# Patient Record
Sex: Female | Born: 1965 | Race: White | Hispanic: No | Marital: Single | Smoking: Never smoker
Health system: Southern US, Community
[De-identification: ages and names within clinical notes are randomized; demographics above are authoritative.]

## PROBLEM LIST (undated history)

## (undated) DIAGNOSIS — R569 Unspecified convulsions: Secondary | ICD-10-CM

## (undated) DIAGNOSIS — E119 Type 2 diabetes mellitus without complications: Secondary | ICD-10-CM

## (undated) DIAGNOSIS — N189 Chronic kidney disease, unspecified: Secondary | ICD-10-CM

## (undated) DIAGNOSIS — E785 Hyperlipidemia, unspecified: Secondary | ICD-10-CM

## (undated) HISTORY — DX: Chronic kidney disease, unspecified: N18.9

## (undated) HISTORY — PX: KNEE SURGERY: SHX244

## (undated) HISTORY — DX: Type 2 diabetes mellitus without complications: E11.9

## (undated) HISTORY — DX: Unspecified convulsions: R56.9

## (undated) HISTORY — DX: Hyperlipidemia, unspecified: E78.5

## (undated) HISTORY — PX: ABDOMINAL HYSTERECTOMY: SHX81

---

## 2008-11-07 ENCOUNTER — Emergency Department: Payer: Self-pay | Admitting: Emergency Medicine

## 2008-12-28 LAB — HM PAP SMEAR: HM Pap smear: NEGATIVE

## 2009-10-04 ENCOUNTER — Emergency Department: Payer: Self-pay | Admitting: Emergency Medicine

## 2010-10-06 ENCOUNTER — Ambulatory Visit: Payer: Self-pay | Admitting: Family Medicine

## 2010-10-06 LAB — HM MAMMOGRAPHY: HM MAMMO: NEGATIVE

## 2011-07-29 ENCOUNTER — Emergency Department: Payer: Self-pay | Admitting: Emergency Medicine

## 2011-08-27 ENCOUNTER — Emergency Department: Payer: Self-pay | Admitting: Emergency Medicine

## 2012-01-20 ENCOUNTER — Emergency Department: Payer: Self-pay | Admitting: Emergency Medicine

## 2014-05-14 LAB — BASIC METABOLIC PANEL
CREATININE: 0.6 mg/dL (ref 0.5–1.1)
Glucose: 116 mg/dL
Potassium: 3.9 mmol/L (ref 3.4–5.3)

## 2014-05-14 LAB — LIPID PANEL
CHOLESTEROL: 139 mg/dL (ref 0–200)
HDL: 56 mg/dL (ref 35–70)
LDL Cholesterol: 55 mg/dL
TRIGLYCERIDES: 141 mg/dL (ref 40–160)

## 2014-05-14 LAB — HEMOGLOBIN A1C: Hgb A1c MFr Bld: 7.2 % — AB (ref 4.0–6.0)

## 2014-11-16 ENCOUNTER — Encounter: Payer: Self-pay | Admitting: Family Medicine

## 2014-11-16 DIAGNOSIS — R809 Proteinuria, unspecified: Secondary | ICD-10-CM | POA: Insufficient documentation

## 2014-11-16 DIAGNOSIS — I10 Essential (primary) hypertension: Secondary | ICD-10-CM | POA: Insufficient documentation

## 2014-11-16 DIAGNOSIS — E785 Hyperlipidemia, unspecified: Secondary | ICD-10-CM | POA: Insufficient documentation

## 2014-11-16 DIAGNOSIS — E669 Obesity, unspecified: Secondary | ICD-10-CM

## 2014-11-16 DIAGNOSIS — E1129 Type 2 diabetes mellitus with other diabetic kidney complication: Secondary | ICD-10-CM | POA: Insufficient documentation

## 2014-11-16 DIAGNOSIS — IMO0001 Reserved for inherently not codable concepts without codable children: Secondary | ICD-10-CM

## 2014-11-17 ENCOUNTER — Other Ambulatory Visit: Payer: Self-pay

## 2014-11-17 ENCOUNTER — Encounter: Payer: Self-pay | Admitting: Family Medicine

## 2014-11-17 ENCOUNTER — Ambulatory Visit (INDEPENDENT_AMBULATORY_CARE_PROVIDER_SITE_OTHER): Payer: 59 | Admitting: Family Medicine

## 2014-11-17 VITALS — BP 103/70 | HR 78 | Temp 97.7°F | Wt 182.0 lb

## 2014-11-17 DIAGNOSIS — R809 Proteinuria, unspecified: Secondary | ICD-10-CM

## 2014-11-17 DIAGNOSIS — I10 Essential (primary) hypertension: Secondary | ICD-10-CM

## 2014-11-17 DIAGNOSIS — E1165 Type 2 diabetes mellitus with hyperglycemia: Secondary | ICD-10-CM | POA: Diagnosis not present

## 2014-11-17 DIAGNOSIS — E785 Hyperlipidemia, unspecified: Secondary | ICD-10-CM | POA: Diagnosis not present

## 2014-11-17 DIAGNOSIS — IMO0001 Reserved for inherently not codable concepts without codable children: Secondary | ICD-10-CM

## 2014-11-17 DIAGNOSIS — D508 Other iron deficiency anemias: Secondary | ICD-10-CM

## 2014-11-17 DIAGNOSIS — E669 Obesity, unspecified: Secondary | ICD-10-CM | POA: Diagnosis not present

## 2014-11-17 LAB — LIPID PANEL PICCOLO, WAIVED
CHOL/HDL RATIO PICCOLO,WAIVE: 2.3 mg/dL
CHOLESTEROL PICCOLO, WAIVED: 149 mg/dL (ref <200)
HDL CHOL PICCOLO, WAIVED: 63 mg/dL (ref >59)
LDL CHOL CALC PICCOLO WAIVED: 64 mg/dL (ref <100)
TRIGLYCERIDES PICCOLO,WAIVED: 107 mg/dL (ref <150)
VLDL CHOL CALC PICCOLO,WAIVE: 21 mg/dL (ref <30)

## 2014-11-17 LAB — MICROALBUMIN, URINE WAIVED
Creatinine, Urine Waived: 10 mg/dL (ref 10–300)
Microalb, Ur Waived: 10 mg/L (ref 0–19)

## 2014-11-17 LAB — BAYER DCA HB A1C WAIVED: HB A1C (BAYER DCA - WAIVED): 7.1 % — ABNORMAL HIGH (ref <7.0)

## 2014-11-17 NOTE — Assessment & Plan Note (Addendum)
Praise given, keep up the excellent job with walking; increase water intake; What It Takes to Lose Weight hand-out given

## 2014-11-17 NOTE — Assessment & Plan Note (Signed)
Eye exam UTD; foot exam by MD today, pt to keep an eye on the broken toe; check feet every night; okay to not check FSBS unless symptomatic or desired; check A1C today, goal is less than 7; continue the walking

## 2014-11-17 NOTE — Telephone Encounter (Signed)
I'm waiting on creaitnine; will plan to refill tomorrow

## 2014-11-17 NOTE — Assessment & Plan Note (Signed)
She is not willing to cut back on her saturated fats, but will try to walk and lose weight; monitor lipids and continue statin

## 2014-11-17 NOTE — Assessment & Plan Note (Signed)
Under excellent control; continue meds; try DASH guidelines

## 2014-11-17 NOTE — Patient Instructions (Signed)
We'll contact you about your lab results Your goal blood pressure is less than  130 on top. Try to follow the DASH guidelines (DASH stands for Dietary Approaches to Stop Hypertension) Try to limit the sodium in your diet.  Ideally, consume less than 1.5 grams (less than 1,500mg ) per day. Do not add salt when cooking or at the table.  Check the sodium amount on labels when shopping, and choose items lower in sodium when given a choice. Avoid or limit foods that already contain a lot of sodium. Eat a diet rich in fruits and vegetables and whole grains. If you need something for aches or pains, try to use Tylenol (acetaminphen) instead of non-steroidals (which include Aleve, ibuprofen, Advil, Motrin, and naproxen); non-steroidals can cause long-term kidney damage Check out the information at familydoctor.org entitled "What It Takes to Lose Weight" Try to lose between 1-2 pounds per week by taking in fewer calories and burning off more calories You can succeed by limiting portions, limiting foods dense in calories and fat, becoming more active, and drinking 8 glasses of water a day Don't skip meals, especially breakfast, as skipping meals may alter your metabolism Do not use over-the-counter weight loss pills or gimmicks that claim rapid weight loss A healthy BMI (or body mass index) is between 18.5 and 24.9 You can calculate your ideal BMI at the NIH website [employer]

## 2014-11-17 NOTE — Assessment & Plan Note (Signed)
Check urine microalbumin today; continue the ARB; limit NSAIDs

## 2014-11-17 NOTE — Telephone Encounter (Signed)
She was seen this morning, needs refills on her meds.

## 2014-11-17 NOTE — Progress Notes (Signed)
BP 103/70 mmHg  Pulse 78  Temp(Src) 97.7 F (36.5 C)  Wt 182 lb (82.555 kg)  SpO2 97%   Subjective:    Patient ID: Kim Andrews, female    DOB: 1966-01-23, 49 y.o.   MRN: [id number]  HPI: Kim Andrews is a 49 y.o. female  Chief Complaint  Patient presents with  . Diabetes  . Hyperlipidemia   Diabetes mellitus type 2 Patient has had diabetes since about age 60 or 20 Checking sugars?  yes How often? A few times Range (low to high) over last two weeks:  Under 130, no lows Feels diabetes is under very good control Does patient feel additional teaching/training would be helpful?  no Trying to limit white bread, white rice, white potatoes, sweets?  yes Trying to limit sweetened drinks like iced tea, soft drinks, sports drinks, fruit juices?  yes Exercise/activity level:  frequent (three or more times per week), walking Checking feet every day/night?  yes Last eye exam:  October 2015, UTD Diabetes-associated complications:  Kidney issues, on medicine to protect Lab Results  Component Value Date   HGBA1C 7.2* 05/14/2014   High cholesterol Patient has had high cholesterol since about 12 or 14 years ago Dietary intake: Do you eat more than 3 eggs per week  no, only deviled eggs at Christmas Do you try to limit saturated fats in your diet?  NO; if I'm going to die, I'm going to die happy, eats what she wants; eats a lot of things she shouldn't she admits Exercise: Exercise/activity level:  frequent (three or more times per week) Does high cholesterol run in your family?   YES Weight:  If overweight or obese, are you trying to lose weight?  yes. Trying to walk; doesn't drink much water at all  Patient saw heart doctor recently was told she broke her 2nd toe left foot; stages of healing; some pain, not limiting what she does; not sure how she broke it; no numbness in the feet  She mentioned that she takes iron every day; was told her iron was low and she needed to take  that  Relevant past medical, surgical, family and social history reviewed and updated as indicated. Interim medical history since our last visit reviewed. Allergies and medications reviewed and updated.  Review of Systems  Constitutional: Negative for unexpected weight change.  HENT: Negative for mouth sores.   Respiratory: Negative for shortness of breath.   Cardiovascular: Negative for chest pain and leg swelling.  Gastrointestinal: Negative for abdominal pain and blood in stool.  Endocrine: Positive for polydipsia (nothing new) and polyuria (nothing new).  Genitourinary: Negative for hematuria.  Musculoskeletal:       Broke her 2nd toe on left foot  Allergic/Immunologic: Negative for environmental allergies.  Neurological: Negative for tremors.  Hematological: Bruises/bleeds easily (nothing new, on aspirin).  Psychiatric/Behavioral: Negative for confusion.    Per HPI unless specifically indicated above     Objective:    BP 103/70 mmHg  Pulse 78  Temp(Src) 97.7 F (36.5 C)  Wt 182 lb (82.555 kg)  SpO2 97%  Wt Readings from Last 3 Encounters:  11/17/14 182 lb (82.555 kg)  05/14/14 179 lb (81.194 kg)    Physical Exam  Constitutional: She appears well-developed and well-nourished. No distress.  HENT:  Head: Normocephalic and atraumatic.  Eyes: EOM are normal. No scleral icterus.  Neck: No thyromegaly present.  Cardiovascular: Normal rate, regular rhythm and normal heart sounds.   No murmur heard.  Pulmonary/Chest: Effort normal and breath sounds normal. No respiratory distress. She has no wheezes.  Abdominal: Soft. Bowel sounds are normal. She exhibits no distension.  Musculoskeletal: Normal range of motion. She exhibits no edema.  Bruise on 2nd toe left foot; no deformity  Neurological: She is alert. She exhibits normal muscle tone.  Skin: Skin is warm and dry. She is not diaphoretic. No pallor.  Psychiatric: She has a normal mood and affect. Her behavior is normal.  Judgment and thought content normal.   Diabetic Foot Form - Detailed   Diabetic Foot Exam - detailed  Diabetic Foot exam was performed with the following findings:  Yes 11/17/2014  9:04 AM  Visual Foot Exam completed.:  Yes  Are the toenails long?:  No  Are the toenails thick?:  No  Normal Range of Motion:  Yes    Pulse Foot Exam completed.:  Yes  Right Dorsalis Pedis:  Present Left Dorsalis Pedis:  Present  Semmes-Weinstein Monofilament Test  R Site 1-Great Toe:  Pos L Site 1-Great Toe:  Pos    Comments:  Bruise on 2nd toe left foot; no deformity      Results for orders placed or performed in visit on 11/16/14  HM MAMMOGRAPHY  Result Value Ref Range   HM Mammogram neg   HM PAP SMEAR  Result Value Ref Range   HM Pap smear neg, no endocervical component   Basic metabolic panel  Result Value Ref Range   Glucose 116 mg/dL   Creatinine 0.6 0.5 - 1.1 mg/dL   Potassium 3.9 3.4 - 5.3 mmol/L  Lipid panel  Result Value Ref Range   Triglycerides 141 40 - 160 mg/dL   Cholesterol 423 0 - 953 mg/dL   HDL 56 35 - 70 mg/dL   LDL Cholesterol 55 mg/dL  Hemoglobin U0E  Result Value Ref Range   Hgb A1c MFr Bld 7.2 (A) 4.0 - 6.0 %      Assessment & Plan:   Problem List Items Addressed This Visit      Cardiovascular and Mediastinum   Benign hypertension - Primary    Under excellent control; continue meds; try DASH guidelines      Relevant Medications   carvedilol (COREG) 3.125 MG tablet   Other Relevant Orders   Comprehensive metabolic panel     Endocrine   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    Eye exam UTD; foot exam by MD today, pt to keep an eye on the broken toe; check feet every night; okay to not check FSBS unless symptomatic or desired; check A1C today, goal is less than 7; continue the walking      Relevant Orders   Hgb A1c w/o eAG   Microalbumin / creatinine urine ratio     Other   Hyperlipemia    She is not willing to  cut back on her saturated fats, but will try to walk and lose weight; monitor lipids and continue statin      Relevant Medications   carvedilol (COREG) 3.125 MG tablet   Other Relevant Orders   Lipid Panel w/o Chol/HDL Ratio   Urine test positive for microalbuminuria    Check urine microalbumin today; continue the ARB; limit NSAIDs      Relevant Orders   Microalbumin / creatinine urine ratio   Obesity    Praise given, keep up the excellent job with walking; increase water intake; What It Takes to Lose Weight hand-out given  Other Visit Diagnoses    Other iron deficiency anemias        check CBC and ferritin today    Relevant Medications    ferrous sulfate (SM IRON) 325 (65 FE) MG tablet    Other Relevant Orders    CBC with Differential/Platelet    Ferritin        Follow up plan: Return in about 3 months (around 02/17/2015) for diabetes and high chol.

## 2014-11-18 ENCOUNTER — Telehealth: Payer: Self-pay | Admitting: Family Medicine

## 2014-11-18 ENCOUNTER — Encounter: Payer: Self-pay | Admitting: Family Medicine

## 2014-11-18 LAB — COMPREHENSIVE METABOLIC PANEL
ALT: 33 IU/L — AB (ref 0–32)
AST: 24 IU/L (ref 0–40)
Albumin/Globulin Ratio: 2.1 (ref 1.1–2.5)
Albumin: 4.4 g/dL (ref 3.5–5.5)
Alkaline Phosphatase: 86 IU/L (ref 39–117)
BUN/Creatinine Ratio: 17 (ref 9–23)
BUN: 11 mg/dL (ref 6–24)
Bilirubin Total: <0.2 mg/dL (ref 0.0–1.2)
CALCIUM: 9.4 mg/dL (ref 8.7–10.2)
CO2: 23 mmol/L (ref 18–29)
CREATININE: 0.66 mg/dL (ref 0.57–1.00)
Chloride: 100 mmol/L (ref 97–108)
GFR calc Af Amer: 121 mL/min/{1.73_m2} (ref >59)
GFR calc non Af Amer: 105 mL/min/{1.73_m2} (ref >59)
GLUCOSE: 147 mg/dL — AB (ref 65–99)
Globulin, Total: 2.1 g/dL (ref 1.5–4.5)
POTASSIUM: 4.4 mmol/L (ref 3.5–5.2)
SODIUM: 142 mmol/L (ref 134–144)
Total Protein: 6.5 g/dL (ref 6.0–8.5)

## 2014-11-18 LAB — CBC WITH DIFFERENTIAL/PLATELET

## 2014-11-18 LAB — FERRITIN: FERRITIN: 222 ng/mL — AB (ref 15–150)

## 2014-11-18 MED ORDER — SAXAGLIPTIN-METFORMIN ER 2.5-1000 MG PO TB24: 1.0000 | ORAL_TABLET | Freq: Two times a day (BID) | ORAL | Status: DC

## 2014-11-18 MED ORDER — VALSARTAN 80 MG PO TABS: 80.0000 mg | ORAL_TABLET | Freq: Every day | ORAL | Status: DC

## 2014-11-18 MED ORDER — SIMVASTATIN 20 MG PO TABS: 20.0000 mg | ORAL_TABLET | Freq: Every day | ORAL | Status: DC

## 2014-11-18 NOTE — Telephone Encounter (Signed)
Patient notified

## 2014-11-18 NOTE — Telephone Encounter (Signed)
I sent refills of meds Let her know that she has plenty of iron stored up She can stop the iron supplement We'll recheck with other labs again in 3 months Her CBC was cancelled for some reason Okay to cancel that test unless lab has it pending (?)

## 2014-11-18 NOTE — Telephone Encounter (Signed)
Please print and send letter; printers not working for me; thanks

## 2014-11-19 NOTE — Telephone Encounter (Signed)
Letter mailed

## 2015-02-17 ENCOUNTER — Encounter: Payer: Self-pay | Admitting: Family Medicine

## 2015-02-17 ENCOUNTER — Ambulatory Visit (INDEPENDENT_AMBULATORY_CARE_PROVIDER_SITE_OTHER): Payer: 59 | Admitting: Family Medicine

## 2015-02-17 VITALS — BP 100/65 | HR 82 | Temp 97.7°F | Ht 63.0 in | Wt 181.0 lb

## 2015-02-17 DIAGNOSIS — E785 Hyperlipidemia, unspecified: Secondary | ICD-10-CM | POA: Diagnosis not present

## 2015-02-17 DIAGNOSIS — IMO0001 Reserved for inherently not codable concepts without codable children: Secondary | ICD-10-CM

## 2015-02-17 DIAGNOSIS — I10 Essential (primary) hypertension: Secondary | ICD-10-CM | POA: Diagnosis not present

## 2015-02-17 DIAGNOSIS — E669 Obesity, unspecified: Secondary | ICD-10-CM

## 2015-02-17 DIAGNOSIS — E1165 Type 2 diabetes mellitus with hyperglycemia: Secondary | ICD-10-CM

## 2015-02-17 DIAGNOSIS — I499 Cardiac arrhythmia, unspecified: Secondary | ICD-10-CM | POA: Diagnosis not present

## 2015-02-17 DIAGNOSIS — Z5181 Encounter for therapeutic drug level monitoring: Secondary | ICD-10-CM | POA: Insufficient documentation

## 2015-02-17 DIAGNOSIS — R809 Proteinuria, unspecified: Secondary | ICD-10-CM

## 2015-02-17 MED ORDER — VALSARTAN 80 MG PO TABS: 80.0000 mg | ORAL_TABLET | Freq: Every day | ORAL | Status: DC

## 2015-02-17 MED ORDER — SAXAGLIPTIN-METFORMIN ER 2.5-1000 MG PO TB24: 1.0000 | ORAL_TABLET | Freq: Two times a day (BID) | ORAL | Status: DC

## 2015-02-17 NOTE — Assessment & Plan Note (Signed)
Work on weight loss; foot exam by MD today; check feet daily; check A1C today, if under 7, return in 6 months; limit sweets, white bread, cut down on potatoes; eye exam yearly; flu shot declined

## 2015-02-17 NOTE — Progress Notes (Signed)
BP 100/65 mmHg  Pulse 82  Temp(Src) 97.7 F (36.5 C)  Ht 5\' 3"  (1.6 m)  Wt 181 lb (82.101 kg)  BMI 32.07 kg/m2  SpO2 99%   Subjective:    Patient ID: Kim Andrews, female    DOB: [birth year], 49 y.o.   MRN: [id number]  HPI: Kim Andrews is a 49 y.o. female  Chief Complaint  Patient presents with  . Diabetes  . Hyperlipidemia  . Hypertension   No interim problems  Diabetes; type 2; she has had diabetes for 11-1/2 years; checks FSBS 1x a week; tries to limit sweet drinks except at church; healthy white bread, kind of wheat, recommended by her heart doctor; does eat a lot of potatoes, knows she shouldn't; she just loves 'em; does walk; she lost one pound since last visit, hard to lose  High cholesterol; no eggs except at Christmas; eats some sausage; rare bacon with a BLT; does eat cheese; not lots of fiber  High blood pressure; adds very little salt to foods; no side effects  Sees cardiologist every 6 months  Relevant past medical, surgical, family and social history reviewed and updated as indicated. Interim medical history since our last visit reviewed. Allergies and medications reviewed and updated.  Review of Systems  Constitutional: Negative for fever and unexpected weight change.  HENT: Negative for sore throat.   Eyes: Negative for visual disturbance.  Respiratory: Negative for shortness of breath.   Cardiovascular: Negative for chest pain and leg swelling.  Gastrointestinal: Negative for abdominal pain and blood in stool.  Endocrine: Negative for polydipsia.  Genitourinary: Negative for hematuria.  Musculoskeletal: Negative for myalgias.  Skin:       itching on top of both feet  Allergic/Immunologic: Negative for food allergies.  Neurological: Negative for tremors.  Hematological: Bruises/bleeds easily (not new).  Per HPI unless specifically indicated above     Objective:    BP 100/65 mmHg  Pulse 82  Temp(Src) 97.7 F (36.5 C)  Ht 5\' 3"  (1.6 m)   Wt 181 lb (82.101 kg)  BMI 32.07 kg/m2  SpO2 99%  Wt Readings from Last 3 Encounters:  02/17/15 181 lb (82.101 kg)  11/17/14 182 lb (82.555 kg)  05/14/14 179 lb (81.194 kg)    Physical Exam  Constitutional: She appears well-developed and well-nourished. No distress.  HENT:  Head: Normocephalic and atraumatic.  Eyes: EOM are normal. No scleral icterus.  Neck: No thyromegaly present.  Cardiovascular: Normal rate, regular rhythm and normal heart sounds.   No murmur heard. Pulmonary/Chest: Effort normal and breath sounds normal. No respiratory distress. She has no wheezes.  Abdominal: Soft. Bowel sounds are normal. She exhibits no distension.  Musculoskeletal: Normal range of motion. She exhibits no edema.  Neurological: She is alert. She exhibits normal muscle tone.  Skin: Skin is warm and dry. She is not diaphoretic. No pallor.  Psychiatric: She has a normal mood and affect. Her behavior is normal. Judgment and thought content normal.   Diabetic Foot Form - Detailed   Diabetic Foot Exam - detailed  Diabetic Foot exam was performed with the following findings:  Yes 02/17/2015  8:29 AM  Visual Foot Exam completed.:  Yes  Are the shoes appropriate in style and fit?:  Yes  Are the toenails long?:  No  Are the toenails thick?:  No  Is there a claw toe deformity?:  No  Are the toenails ingrown?:  No    Pulse Foot Exam completed.:  Yes  Right Dorsalis Pedis:  Present Left Dorsalis Pedis:  Present  Sensory Foot Exam Completed.:  Yes  Swelling:  No  Semmes-Weinstein Monofilament Test  R Site 1-Great Toe:  Pos L Site 1-Great Toe:  Pos  R Site 4:  Pos L Site 4:  Pos  R Site 5:  Pos L Site 5:  Pos       Results for orders placed or performed in visit on 11/17/14  Comprehensive metabolic panel  Result Value Ref Range   Glucose 147 (H) 65 - 99 mg/dL   BUN 11 6 - 24 mg/dL   Creatinine, Ser 5.40 0.57 - 1.00 mg/dL   GFR calc non Af Amer 105 >59 mL/min/1.73   GFR calc Af Amer 121 >59  mL/min/1.73   BUN/Creatinine Ratio 17 9 - 23   Sodium 142 134 - 144 mmol/L   Potassium 4.4 3.5 - 5.2 mmol/L   Chloride 100 97 - 108 mmol/L   CO2 23 18 - 29 mmol/L   Calcium 9.4 8.7 - 10.2 mg/dL   Total Protein 6.5 6.0 - 8.5 g/dL   Albumin 4.4 3.5 - 5.5 g/dL   Globulin, Total 2.1 1.5 - 4.5 g/dL   Albumin/Globulin Ratio 2.1 1.1 - 2.5   Bilirubin Total <0.2 0.0 - 1.2 mg/dL   Alkaline Phosphatase 86 39 - 117 IU/L   AST 24 0 - 40 IU/L   ALT 33 (H) 0 - 32 IU/L  CBC with Differential/Platelet  Result Value Ref Range   WBC CANCELED    RBC CANCELED    Hemoglobin CANCELED    Hematocrit CANCELED %   Platelets CANCELED    Neutrophils CANCELED    Lymphs CANCELED    Monocytes CANCELED    Eos CANCELED    IMMATURE CELLS CANCELED    Lymphocytes Absolute CANCELED    EOS (ABSOLUTE) CANCELED    Basophils Absolute CANCELED    NRBC CANCELED %  Ferritin  Result Value Ref Range   Ferritin 222 (H) 15 - 150 ng/mL  Lipid Panel Piccolo, Waived  Result Value Ref Range   Cholesterol Piccolo, Waived 149 <200 mg/dL   HDL Chol Piccolo, Waived 63 >59 mg/dL   Triglycerides Piccolo,Waived 107 <150 mg/dL   Chol/HDL Ratio Piccolo,Waive 2.3 mg/dL   LDL Chol Calc Piccolo Waived 64 <100 mg/dL   VLDL Chol Calc Piccolo,Waive 21 <30 mg/dL  Microalbumin, Urine Waived  Result Value Ref Range   Microalb, Ur Waived 10 0 - 19 mg/L   Creatinine, Urine Waived 10 10 - 300 mg/dL   Microalb/Creat Ratio 30-300 (H) <30 mg/g  Bayer DCA Hb A1c Waived  Result Value Ref Range   Bayer DCA Hb A1c Waived 7.1 (H) <7.0 %      Assessment & Plan:   Problem List Items Addressed This Visit      Cardiovascular and Mediastinum   Benign hypertension    Very well-controlled today; limit salt; DASH guidelines      Relevant Medications   valsartan (DIOVAN) 80 MG tablet     Endocrine   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 - Primary    Work on weight loss; foot exam by MD today; check  feet daily; check A1C today, if under 7, return in 6 months; limit sweets, white bread, cut down on potatoes; eye exam yearly; flu shot declined      Relevant Medications   Saxagliptin-Metformin 2.10-998 MG TB24   valsartan (DIOVAN) 80 MG tablet   Other Relevant  Orders   Hgb A1c w/o eAG   Microalbumin / creatinine urine ratio     Other   Hyperlipemia    Limit cheese, saturated fats; stay active; work on weight loss; continue statin      Relevant Medications   valsartan (DIOVAN) 80 MG tablet   Other Relevant Orders   Lipid Panel w/o Chol/HDL Ratio   Urine test positive for microalbuminuria    Check urine today; continue ARB; limit animal protein      Relevant Orders   Microalbumin / creatinine urine ratio   Obesity    Work on weight loss      Relevant Medications   Saxagliptin-Metformin 2.10-998 MG TB24   Irregular heart beat    Follow-up with Dr. Hyacinth Meeker at [facility]      Medication monitoring encounter    Check sgpt, K+, creatinine      Relevant Orders   Comprehensive metabolic panel      Follow up plan: Return in about 6 months (around 08/17/2015) for diabetes, high cholesterol.  An after-visit summary was printed and given to the patient at check-out.  Please see the patient instructions which may contain other information and recommendations beyond what is mentioned above in the assessment and plan. Meds ordered this encounter  Medications  . Saxagliptin-Metformin 2.10-998 MG TB24    Sig: Take 1 tablet by mouth 2 (two) times daily.    Dispense:  60 tablet    Refill:  5  . valsartan (DIOVAN) 80 MG tablet    Sig: Take 1 tablet (80 mg total) by mouth daily.    Dispense:  30 tablet    Refill:  5   Orders Placed This Encounter  Procedures  . Hgb A1c w/o eAG  . Lipid Panel w/o Chol/HDL Ratio  . Comprehensive metabolic panel  . Microalbumin / creatinine urine ratio

## 2015-02-17 NOTE — Assessment & Plan Note (Signed)
Check sgpt, K+, creatinine

## 2015-02-17 NOTE — Assessment & Plan Note (Signed)
Work on weight loss.

## 2015-02-17 NOTE — Assessment & Plan Note (Signed)
Check urine today; continue ARB; limit animal protein

## 2015-02-17 NOTE — Assessment & Plan Note (Signed)
Limit cheese, saturated fats; stay active; work on weight loss; continue statin

## 2015-02-17 NOTE — Assessment & Plan Note (Signed)
Follow-up with Dr. Hyacinth Meeker at [facility]

## 2015-02-17 NOTE — Assessment & Plan Note (Signed)
Very well-controlled today; limit salt; DASH guidelines

## 2015-02-17 NOTE — Patient Instructions (Addendum)
Try the DASH guidelines See you eye doctor yearly Check your feet every night We'll mail you the lab results Return in 6 months  DASH Eating Plan DASH stands for "Dietary Approaches to Stop Hypertension." The DASH eating plan is a healthy eating plan that has been shown to reduce high blood pressure (hypertension). Additional health benefits may include reducing the risk of type 2 diabetes mellitus, heart disease, and stroke. The DASH eating plan may also help with weight loss. WHAT DO I NEED TO KNOW ABOUT THE DASH EATING PLAN? For the DASH eating plan, you will follow these general guidelines:  Choose foods with a percent daily value for sodium of less than 5% (as listed on the food label).  Use salt-free seasonings or herbs instead of table salt or sea salt.  Check with your health care provider or pharmacist before using salt substitutes.  Eat lower-sodium products, often labeled as "lower sodium" or "no salt added."  Eat fresh foods.  Eat more vegetables, fruits, and low-fat dairy products.  Choose whole grains. Look for the word "whole" as the first word in the ingredient list.  Choose fish and skinless chicken or [place] more often than red meat. Limit fish, poultry, and meat to 6 oz (170 g) each day.  Limit sweets, desserts, sugars, and sugary drinks.  Choose heart-healthy fats.  Limit cheese to 1 oz (28 g) per day.  Eat more home-cooked food and less restaurant, buffet, and fast food.  Limit fried foods.  Cook foods using methods other than frying.  Limit canned vegetables. If you do use them, rinse them well to decrease the sodium.  When eating at a restaurant, ask that your food be prepared with less salt, or no salt if possible. WHAT FOODS CAN I EAT? Seek help from a dietitian for individual calorie needs. Grains Whole grain or whole wheat bread. Brown rice. Whole grain or whole wheat pasta. Quinoa, bulgur, and whole grain cereals. Low-sodium cereals. Corn or  whole wheat flour tortillas. Whole grain cornbread. Whole grain crackers. Low-sodium crackers. Vegetables Fresh or frozen vegetables (raw, steamed, roasted, or grilled). Low-sodium or reduced-sodium tomato and vegetable juices. Low-sodium or reduced-sodium tomato sauce and paste. Low-sodium or reduced-sodium canned vegetables.  Fruits All fresh, canned (in natural juice), or frozen fruits. Meat and Other Protein Products Ground beef (85% or leaner), grass-fed beef, or beef trimmed of fat. Skinless chicken or [place]. Ground chicken or [place]. Pork trimmed of fat. All fish and seafood. Eggs. Dried beans, peas, or lentils. Unsalted nuts and seeds. Unsalted canned beans. Dairy Low-fat dairy products, such as skim or 1% milk, 2% or reduced-fat cheeses, low-fat ricotta or cottage cheese, or plain low-fat yogurt. Low-sodium or reduced-sodium cheeses. Fats and Oils Tub margarines without trans fats. Light or reduced-fat mayonnaise and salad dressings (reduced sodium). Avocado. Safflower, olive, or canola oils. Natural peanut or almond butter. Other Unsalted popcorn and pretzels. The items listed above may not be a complete list of recommended foods or beverages. Contact your dietitian for more options. WHAT FOODS ARE NOT RECOMMENDED? Grains White bread. White pasta. White rice. Refined cornbread. Bagels and croissants. Crackers that contain trans fat. Vegetables Creamed or fried vegetables. Vegetables in a cheese sauce. Regular canned vegetables. Regular canned tomato sauce and paste. Regular tomato and vegetable juices. Fruits Dried fruits. Canned fruit in light or heavy syrup. Fruit juice. Meat and Other Protein Products Fatty cuts of meat. Ribs, chicken wings, bacon, sausage, bologna, salami, chitterlings, fatback, hot dogs, bratwurst, and packaged  luncheon meats. Salted nuts and seeds. Canned beans with salt. Dairy Whole or 2% milk, cream, half-and-half, and cream cheese. Whole-fat or sweetened  yogurt. Full-fat cheeses or blue cheese. Nondairy creamers and whipped toppings. Processed cheese, cheese spreads, or cheese curds. Condiments Onion and garlic salt, seasoned salt, table salt, and sea salt. Canned and packaged gravies. Worcestershire sauce. Tartar sauce. Barbecue sauce. Teriyaki sauce. Soy sauce, including reduced sodium. Steak sauce. Fish sauce. Oyster sauce. Cocktail sauce. Horseradish. Ketchup and mustard. Meat flavorings and tenderizers. Bouillon cubes. Hot sauce. Tabasco sauce. Marinades. Taco seasonings. Relishes. Fats and Oils Butter, stick margarine, lard, shortening, ghee, and bacon fat. Coconut, palm kernel, or palm oils. Regular salad dressings. Other Pickles and olives. Salted popcorn and pretzels. The items listed above may not be a complete list of foods and beverages to avoid. Contact your dietitian for more information. WHERE CAN I FIND MORE INFORMATION? National Heart, Lung, and Blood Institute: travelstabloid.com Document Released: 05/10/2011 Document Revised: 10/05/2013 Document Reviewed: 03/25/2013 [facility] Patient Information 2015 [place], [place]. This information is not intended to replace advice given to you by your health care provider. Make sure you discuss any questions you have with your health care provider.

## 2015-02-18 LAB — COMPREHENSIVE METABOLIC PANEL
ALBUMIN: 4.1 g/dL (ref 3.5–5.5)
ALK PHOS: 90 IU/L (ref 39–117)
ALT: 28 IU/L (ref 0–32)
AST: 23 IU/L (ref 0–40)
Albumin/Globulin Ratio: 2 (ref 1.1–2.5)
BUN/Creatinine Ratio: 15 (ref 9–23)
BUN: 10 mg/dL (ref 6–24)
Bilirubin Total: 0.2 mg/dL (ref 0.0–1.2)
CO2: 23 mmol/L (ref 18–29)
Calcium: 9.2 mg/dL (ref 8.7–10.2)
Chloride: 100 mmol/L (ref 97–108)
Creatinine, Ser: 0.68 mg/dL (ref 0.57–1.00)
GFR calc Af Amer: 120 mL/min/{1.73_m2} (ref >59)
GFR, EST NON AFRICAN AMERICAN: 104 mL/min/{1.73_m2} (ref >59)
GLUCOSE: 162 mg/dL — AB (ref 65–99)
Globulin, Total: 2.1 g/dL (ref 1.5–4.5)
Potassium: 4.1 mmol/L (ref 3.5–5.2)
Sodium: 141 mmol/L (ref 134–144)
Total Protein: 6.2 g/dL (ref 6.0–8.5)

## 2015-02-18 LAB — MICROALBUMIN / CREATININE URINE RATIO: CREATININE, UR: 28.5 mg/dL

## 2015-02-18 LAB — LIPID PANEL W/O CHOL/HDL RATIO
Cholesterol, Total: 142 mg/dL (ref 100–199)
HDL: 53 mg/dL (ref >39)
LDL Calculated: 56 mg/dL (ref 0–99)
Triglycerides: 163 mg/dL — ABNORMAL HIGH (ref 0–149)
VLDL CHOLESTEROL CAL: 33 mg/dL (ref 5–40)

## 2015-02-18 LAB — HGB A1C W/O EAG: HEMOGLOBIN A1C: 7.2 % — AB (ref 4.8–5.6)

## 2015-02-25 ENCOUNTER — Telehealth: Payer: Self-pay | Admitting: Family Medicine

## 2015-02-25 NOTE — Telephone Encounter (Signed)
I talked with patient about her labs A1C not controlled; we talked about seeing back in 3 months if uncontrolled, but she really asked if she could go six months; we are a team so I will work with her; she knows she is not to goal, does not want to check labs next right at Christmas time She is not interested in starting new medicine for her diabetes; not quite to goal; suggested medicine, discussed some are $$$, one is very inexpensive (glucotrol), but she does not want to start that either Cholesterol is wonderful Urine microalbumin back to normal; fantastic I am willing to see her back in SIX months, may refill pills until then

## 2015-06-10 ENCOUNTER — Other Ambulatory Visit: Payer: Self-pay | Admitting: Family Medicine

## 2015-06-10 MED ORDER — SAXAGLIPTIN-METFORMIN ER 2.5-1000 MG PO TB24: 1.0000 | ORAL_TABLET | Freq: Two times a day (BID) | ORAL | Status: DC

## 2015-06-10 NOTE — Telephone Encounter (Signed)
Last phone note and labs reviewed; Rx approved

## 2015-06-10 NOTE — Telephone Encounter (Signed)
Pt called stated she needs a refill on Kombiglyze. [facility] [place] Thanks.

## 2015-06-10 NOTE — Telephone Encounter (Signed)
Last Visit: 02/17/2015 Next Appt: 08/17/2015 [facility] [address] [place], [place]  Request for Saxagliptin-Metformin (Kombiglyze) 2.10-998  mg tab.

## 2015-08-17 ENCOUNTER — Encounter: Payer: Self-pay | Admitting: Family Medicine

## 2015-08-17 ENCOUNTER — Telehealth: Payer: Self-pay | Admitting: Family Medicine

## 2015-08-17 ENCOUNTER — Ambulatory Visit (INDEPENDENT_AMBULATORY_CARE_PROVIDER_SITE_OTHER): Payer: BLUE CROSS/BLUE SHIELD | Admitting: Family Medicine

## 2015-08-17 VITALS — BP 116/78 | HR 80 | Temp 97.7°F | Ht 62.5 in | Wt 181.0 lb

## 2015-08-17 DIAGNOSIS — E669 Obesity, unspecified: Secondary | ICD-10-CM | POA: Diagnosis not present

## 2015-08-17 DIAGNOSIS — R809 Proteinuria, unspecified: Secondary | ICD-10-CM

## 2015-08-17 DIAGNOSIS — I10 Essential (primary) hypertension: Secondary | ICD-10-CM

## 2015-08-17 DIAGNOSIS — E785 Hyperlipidemia, unspecified: Secondary | ICD-10-CM | POA: Diagnosis not present

## 2015-08-17 DIAGNOSIS — E1165 Type 2 diabetes mellitus with hyperglycemia: Secondary | ICD-10-CM | POA: Diagnosis not present

## 2015-08-17 DIAGNOSIS — Z5181 Encounter for therapeutic drug level monitoring: Secondary | ICD-10-CM | POA: Diagnosis not present

## 2015-08-17 DIAGNOSIS — IMO0001 Reserved for inherently not codable concepts without codable children: Secondary | ICD-10-CM

## 2015-08-17 NOTE — Assessment & Plan Note (Signed)
Well-controlled; try to follow DASH guidelines 

## 2015-08-17 NOTE — Patient Instructions (Addendum)
Try krill oil capsules twice a day instead of fish or cod liver oil Please see your eye doctor, and have your eyes examined every year Check your feet every night and let me know right away of any sores, infections, numbness, etc. Try to limit sweets, white bread, white rice, white potatoes It is okay for you to not check your fingerstick blood sugars (per [employer] Best Practices) We'll contact you about labs drawn today If you have not heard anything from my staff in a week about any orders/referrals/studies from today, please contact us here to follow-up [phone] Try to work on modest weight loss, about 10 to 15 pounds before your next appointment Return in 6 months for diabetes and fasting labs

## 2015-08-17 NOTE — Assessment & Plan Note (Signed)
Check lipids today; continue statin; limit fatty foods

## 2015-08-17 NOTE — Telephone Encounter (Signed)
Erroneous entry

## 2015-08-17 NOTE — Assessment & Plan Note (Signed)
Limit red meat protein and try to get more protein from plants

## 2015-08-17 NOTE — Progress Notes (Signed)
BP 116/78 mmHg  Pulse 80  Temp(Src) 97.7 F (36.5 C)  Ht 5' 2.5" (1.588 m)  Wt 181 lb (82.101 kg)  BMI 32.56 kg/m2  SpO2 95%   Subjective:    Patient ID: Kim BouillonAngela D Andrews, female    DOB: 1965/08/12, 50 y.o.   MRN: [id number]  HPI: Kim Andrews is a 50 y.o. female  Chief Complaint  Patient presents with  . Diabetes    follow up and labs; she is up to date on her pneumonia vaccine  . Hypertension    follow up and labs  . Hyperlipidemia    follow up and labs   Type 2 diabetes; FSBS twice a week; not over 200, but would like them to be under 130; no problems with feet; no numbness or sores; no dry mouth; very few sweets, just rare occasions; last eye exam Oct; no eye problems from diabetes  HTN; well-controlled; not checking BP away from doctor; tries to limit salt; does not cook with salt, just a little at the table to taste  High cholesterol; not much fatty foods; no eggs; loves cheese; no problems with statin  Relevant past medical, surgical, family and social history reviewed and updated as indicated Father had insulin-dependent diabetes; eye problems from diabetes as well  Interim medical history since our last visit reviewed; no medical excitement since last visit  Allergies and medications reviewed and updated.  Review of Systems Per HPI unless specifically indicated above     Objective:    BP 116/78 mmHg  Pulse 80  Temp(Src) 97.7 F (36.5 C)  Ht 5' 2.5" (1.588 m)  Wt 181 lb (82.101 kg)  BMI 32.56 kg/m2  SpO2 95%  Wt Readings from Last 3 Encounters:  08/17/15 181 lb (82.101 kg)  02/17/15 181 lb (82.101 kg)  11/17/14 182 lb (82.555 kg)    Physical Exam  Constitutional: She appears well-developed and well-nourished. No distress.  HENT:  Head: Normocephalic and atraumatic.  Eyes: EOM are normal. No scleral icterus.  Neck: No thyromegaly present.  Cardiovascular: Normal rate, regular rhythm and normal heart sounds.   No murmur heard. Pulmonary/Chest:  Effort normal and breath sounds normal. No respiratory distress. She has no wheezes.  Abdominal: Soft. She exhibits no distension.  Musculoskeletal: Normal range of motion. She exhibits no edema.  Neurological: She is alert. She exhibits normal muscle tone.  Skin: Skin is warm and dry. She is not diaphoretic. No pallor.  Psychiatric: She has a normal mood and affect. Her behavior is normal. Judgment and thought content normal.    Results for orders placed or performed in visit on 02/17/15  Hgb A1c w/o eAG  Result Value Ref Range   Hgb A1c MFr Bld 7.2 (H) 4.8 - 5.6 %  Lipid Panel w/o Chol/HDL Ratio  Result Value Ref Range   Cholesterol, Total 142 100 - 199 mg/dL   Triglycerides 409163 (H) 0 - 149 mg/dL   HDL 53 >81>39 mg/dL   VLDL Cholesterol Cal 33 5 - 40 mg/dL   LDL Calculated 56 0 - 99 mg/dL  Comprehensive metabolic panel  Result Value Ref Range   Glucose 162 (H) 65 - 99 mg/dL   BUN 10 6 - 24 mg/dL   Creatinine, Ser 1.910.68 0.57 - 1.00 mg/dL   GFR calc non Af Amer 104 >59 mL/min/1.73   GFR calc Af Amer 120 >59 mL/min/1.73   BUN/Creatinine Ratio 15 9 - 23   Sodium 141 134 - 144 mmol/L  Potassium 4.1 3.5 - 5.2 mmol/L   Chloride 100 97 - 108 mmol/L   CO2 23 18 - 29 mmol/L   Calcium 9.2 8.7 - 10.2 mg/dL   Total Protein 6.2 6.0 - 8.5 g/dL   Albumin 4.1 3.5 - 5.5 g/dL   Globulin, Total 2.1 1.5 - 4.5 g/dL   Albumin/Globulin Ratio 2.0 1.1 - 2.5   Bilirubin Total 0.2 0.0 - 1.2 mg/dL   Alkaline Phosphatase 90 39 - 117 IU/L   AST 23 0 - 40 IU/L   ALT 28 0 - 32 IU/L  Microalbumin / creatinine urine ratio  Result Value Ref Range   Creatinine, Urine 28.5 Not Estab. mg/dL   Microalbum.,U,Random <3.0 Not Estab. 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 - Primary    Eye exam and foot exam UTD; limiting sweets; check A1c today; moisturize feet and keep an eye on them daily      Relevant Orders   Hgb A1c w/o eAG     Other   Hyperlipemia    Check lipids today; continue statin; limit fatty foods      Relevant Medications   carvedilol (COREG) 3.125 MG tablet   Other Relevant Orders   Lipid Panel w/o Chol/HDL Ratio   Urine test positive for microalbuminuria    Limit red meat protein and try to get more protein from plants      Relevant Orders   Microalbumin / creatinine urine ratio   Obesity    Try to lose 10-15 pounds over the coming 6 months      Medication monitoring encounter    Check labs      Relevant Orders   Comprehensive metabolic panel       Follow up plan: Return in about 6 months (around 02/17/2016) for twenty minute visit for diabetes, cholesterol.  Orders Placed This Encounter  Procedures  . Lipid Panel w/o Chol/HDL Ratio  . Hgb A1c w/o eAG  . Comprehensive metabolic panel  . Microalbumin / creatinine urine ratio   An after-visit summary was printed and given to the patient at check-out.  Please see the patient instructions which may contain other information and recommendations beyond what is mentioned above in the assessment and plan.

## 2015-08-17 NOTE — Assessment & Plan Note (Signed)
Check labs 

## 2015-08-17 NOTE — Assessment & Plan Note (Signed)
Try to lose 10-15 pounds over the coming 6 months

## 2015-08-17 NOTE — Assessment & Plan Note (Signed)
Eye exam and foot exam UTD; limiting sweets; check A1c today; moisturize feet and keep an eye on them daily

## 2015-08-18 LAB — COMPREHENSIVE METABOLIC PANEL
ALT: 32 IU/L (ref 0–32)
AST: 26 IU/L (ref 0–40)
Albumin/Globulin Ratio: 1.8 (ref 1.2–2.2)
Albumin: 4.2 g/dL (ref 3.5–5.5)
Alkaline Phosphatase: 98 IU/L (ref 39–117)
BUN/Creatinine Ratio: 11 (ref 9–23)
BUN: 7 mg/dL (ref 6–24)
Bilirubin Total: <0.2 mg/dL (ref 0.0–1.2)
CALCIUM: 9.3 mg/dL (ref 8.7–10.2)
CO2: 25 mmol/L (ref 18–29)
CREATININE: 0.63 mg/dL (ref 0.57–1.00)
Chloride: 100 mmol/L (ref 96–106)
GFR calc Af Amer: 122 mL/min/{1.73_m2} (ref >59)
GFR, EST NON AFRICAN AMERICAN: 106 mL/min/{1.73_m2} (ref >59)
GLOBULIN, TOTAL: 2.3 g/dL (ref 1.5–4.5)
Glucose: 180 mg/dL — ABNORMAL HIGH (ref 65–99)
Potassium: 4.1 mmol/L (ref 3.5–5.2)
Sodium: 142 mmol/L (ref 134–144)
Total Protein: 6.5 g/dL (ref 6.0–8.5)

## 2015-08-18 LAB — LIPID PANEL W/O CHOL/HDL RATIO
CHOLESTEROL TOTAL: 153 mg/dL (ref 100–199)
HDL: 58 mg/dL (ref >39)
LDL CALC: 77 mg/dL (ref 0–99)
Triglycerides: 89 mg/dL (ref 0–149)
VLDL Cholesterol Cal: 18 mg/dL (ref 5–40)

## 2015-08-18 LAB — MICROALBUMIN / CREATININE URINE RATIO: Creatinine, Urine: 27.3 mg/dL

## 2015-08-18 LAB — HGB A1C W/O EAG: HEMOGLOBIN A1C: 7.8 % — AB (ref 4.8–5.6)

## 2015-08-22 ENCOUNTER — Encounter: Payer: Self-pay | Admitting: Family Medicine

## 2015-08-23 ENCOUNTER — Other Ambulatory Visit: Payer: Self-pay

## 2015-08-23 ENCOUNTER — Telehealth: Payer: Self-pay

## 2015-08-23 MED ORDER — SAXAGLIPTIN-METFORMIN ER 2.5-1000 MG PO TB24: 1.0000 | ORAL_TABLET | Freq: Two times a day (BID) | ORAL | Status: DC

## 2015-08-23 NOTE — Telephone Encounter (Signed)
rx approved

## 2015-08-23 NOTE — Telephone Encounter (Signed)
Patient request that Dr. Marlise EvesLada's "nurse" call her to answer questions regarding her labs.

## 2015-08-23 NOTE — Telephone Encounter (Signed)
Patient wanted to know her lab results. I advised her that a letter would be coming to her with the results, but I read the letter to her. She says she wants to work on her diet and not add another diabetes med at this time.

## 2015-08-23 NOTE — Telephone Encounter (Signed)
Routing to provider  

## 2016-02-16 ENCOUNTER — Ambulatory Visit (INDEPENDENT_AMBULATORY_CARE_PROVIDER_SITE_OTHER): Payer: BLUE CROSS/BLUE SHIELD | Admitting: Family Medicine

## 2016-02-16 ENCOUNTER — Encounter: Payer: Self-pay | Admitting: Family Medicine

## 2016-02-16 VITALS — BP 104/72 | HR 84 | Temp 98.2°F | Wt 182.0 lb

## 2016-02-16 DIAGNOSIS — E1165 Type 2 diabetes mellitus with hyperglycemia: Secondary | ICD-10-CM | POA: Diagnosis not present

## 2016-02-16 DIAGNOSIS — E785 Hyperlipidemia, unspecified: Secondary | ICD-10-CM | POA: Diagnosis not present

## 2016-02-16 DIAGNOSIS — R809 Proteinuria, unspecified: Secondary | ICD-10-CM

## 2016-02-16 DIAGNOSIS — I10 Essential (primary) hypertension: Secondary | ICD-10-CM

## 2016-02-16 DIAGNOSIS — IMO0001 Reserved for inherently not codable concepts without codable children: Secondary | ICD-10-CM

## 2016-02-16 LAB — LIPID PANEL PICCOLO, WAIVED
CHOL/HDL RATIO PICCOLO,WAIVE: 2.5 mg/dL
Cholesterol Piccolo, Waived: 138 mg/dL (ref <200)
HDL CHOL PICCOLO, WAIVED: 55 mg/dL — AB (ref >59)
LDL Chol Calc Piccolo Waived: 61 mg/dL (ref <100)
TRIGLYCERIDES PICCOLO,WAIVED: 108 mg/dL (ref <150)
VLDL Chol Calc Piccolo,Waive: 22 mg/dL (ref <30)

## 2016-02-16 LAB — BAYER DCA HB A1C WAIVED: HB A1C: 7.5 % — AB (ref <7.0)

## 2016-02-16 MED ORDER — SAXAGLIPTIN-METFORMIN ER 2.5-1000 MG PO TB24: 1.0000 | ORAL_TABLET | Freq: Two times a day (BID) | ORAL | 6 refills | Status: DC

## 2016-02-16 MED ORDER — SIMVASTATIN 20 MG PO TABS: 20.0000 mg | ORAL_TABLET | Freq: Every day | ORAL | 5 refills | Status: DC

## 2016-02-16 MED ORDER — MELOXICAM 15 MG PO TABS: 15.0000 mg | ORAL_TABLET | Freq: Every day | ORAL | 3 refills | Status: DC

## 2016-02-16 MED ORDER — DULAGLUTIDE 0.75 MG/0.5ML ~~LOC~~ SOAJ: 0.7500 mg | SUBCUTANEOUS | 3 refills | Status: DC

## 2016-02-16 NOTE — Progress Notes (Signed)
BP 104/72 (BP Location: Left Arm, Patient Position: Sitting, Cuff Size: Large)   Pulse 84   Temp 98.2 F (36.8 C)   Wt 182 lb (82.6 kg)   SpO2 96%   BMI 32.76 kg/m    Subjective:    Patient ID: Kim Andrews, female    DOB: 09/25/1965, 50 y.o.   MRN: [id number]  HPI: Kim Andrews is a 50 y.o. female  Chief Complaint  Patient presents with  . Diabetes  . Hyperlipidemia   HYPERTENSION / HYPERLIPIDEMIA Satisfied with current treatment? yes Duration of hypertension: chronic BP monitoring frequency: not checking BP medication side effects: no Past BP meds: valsartan and carvedilol from cardiologist Duration of hyperlipidemia: chronic Cholesterol medication side effects: no Cholesterol supplements: none Past cholesterol medications: simvastatin (zocor) Medication compliance: excellent compliance Aspirin: yes Recent stressors: no Recurrent headaches: no Visual changes: no Palpitations: no Dyspnea: no Chest pain: no Lower extremity edema: no Dizzy/lightheaded: no  DIABETES Hypoglycemic episodes:no Polydipsia/polyuria: no Visual disturbance: no Chest pain: no Paresthesias: no Glucose Monitoring: yes  Accucheck frequency: Daily Taking Insulin?: no Blood Pressure Monitoring: not checking Retinal Examination: Up to Date Foot Exam: Up to Date Diabetic Education: Completed Pneumovax: Up to Date Influenza: Refused Aspirin: yes  Relevant past medical, surgical, family and social history reviewed and updated as indicated. Interim medical history since our last visit reviewed. Allergies and medications reviewed and updated.  Review of Systems  Constitutional: Negative.   Respiratory: Negative.   Cardiovascular: Negative.   Psychiatric/Behavioral: Negative.     Per HPI unless specifically indicated above     Objective:    BP 104/72 (BP Location: Left Arm, Patient Position: Sitting, Cuff Size: Large)   Pulse 84   Temp 98.2 F (36.8 C)   Wt 182 lb (82.6  kg)   SpO2 96%   BMI 32.76 kg/m   Wt Readings from Last 3 Encounters:  02/16/16 182 lb (82.6 kg)  08/17/15 181 lb (82.1 kg)  02/17/15 181 lb (82.1 kg)    Physical Exam  Constitutional: She is oriented to person, place, and time. She appears well-developed and well-nourished. No distress.  HENT:  Head: Normocephalic and atraumatic.  Right Ear: Hearing normal.  Left Ear: Hearing normal.  Nose: Nose normal.  Eyes: Conjunctivae and lids are normal. Right eye exhibits no discharge. Left eye exhibits no discharge. No scleral icterus.  Cardiovascular: Normal rate, regular rhythm, normal heart sounds and intact distal pulses.  Exam reveals no gallop and no friction rub.   No murmur heard. Pulmonary/Chest: Effort normal and breath sounds normal. No respiratory distress. She has no wheezes. She has no rales. She exhibits no tenderness.  Musculoskeletal: Normal range of motion.  Neurological: She is alert and oriented to person, place, and time.  Skin: Skin is warm, dry and intact. No rash noted. No erythema. No pallor.  Psychiatric: She has a normal mood and affect. Her speech is normal and behavior is normal. Judgment and thought content normal. Cognition and memory are normal.  Nursing note and vitals reviewed.   Results for orders placed or performed in visit on 08/17/15  Lipid Panel w/o Chol/HDL Ratio  Result Value Ref Range   Cholesterol, Total 153 100 - 199 mg/dL   Triglycerides 89 0 - 149 mg/dL   HDL 58 >40>39 mg/dL   VLDL Cholesterol Cal 18 5 - 40 mg/dL   LDL Calculated 77 0 - 99 mg/dL  Hgb [id number] w/o eAG  Result Value Ref Range  Hgb A1c MFr Bld 7.8 (H) 4.8 - 5.6 %  Comprehensive metabolic panel  Result Value Ref Range   Glucose 180 (H) 65 - 99 mg/dL   BUN 7 6 - 24 mg/dL   Creatinine, Ser 1.61 0.57 - 1.00 mg/dL   GFR calc non Af Amer 106 >59 mL/min/1.73   GFR calc Af Amer 122 >59 mL/min/1.73   BUN/Creatinine Ratio 11 9 - 23   Sodium 142 134 - 144 mmol/L   Potassium 4.1 3.5 -  5.2 mmol/L   Chloride 100 96 - 106 mmol/L   CO2 25 18 - 29 mmol/L   Calcium 9.3 8.7 - 10.2 mg/dL   Total Protein 6.5 6.0 - 8.5 g/dL   Albumin 4.2 3.5 - 5.5 g/dL   Globulin, Total 2.3 1.5 - 4.5 g/dL   Albumin/Globulin Ratio 1.8 1.2 - 2.2   Bilirubin Total <0.2 0.0 - 1.2 mg/dL   Alkaline Phosphatase 98 39 - 117 IU/L   AST 26 0 - 40 IU/L   ALT 32 0 - 32 IU/L  Microalbumin / creatinine urine ratio  Result Value Ref Range   Creatinine, Urine 27.3 Not Estab. mg/dL   Microalbum.,U,Random <3.0 Not Estab. ug/mL   MICROALB/CREAT RATIO <11.0 0.0 - 30.0 mg/g creat      Assessment & Plan:   Problem List Items Addressed This Visit      Cardiovascular and Mediastinum   Benign hypertension    Under good control. Continue current regimen. Continue to monitor.       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 First shot given in the office today. Recheck 3 months.       Relevant Medications   simvastatin (ZOCOR) 20 MG tablet   Saxagliptin-Metformin 2.10-998 MG TB24   Dulaglutide (TRULICITY) 0.75 MG/0.5ML SOPN   Other Relevant Orders   Bayer DCA Hb A1c Waived   Comprehensive metabolic panel     Other   Hyperlipemia    Under good control. Continue current regimen. Continue to monitor.       Relevant Medications   simvastatin (ZOCOR) 20 MG tablet   Other Relevant Orders   Comprehensive metabolic panel   Lipid Panel Piccolo, Waived    Other Visit Diagnoses   None.      Follow up plan: Return in about 3 months (around 05/17/2016) for DM visit, 6 months physical.

## 2016-02-16 NOTE — Assessment & Plan Note (Signed)
Under good control. Continue current regimen. Continue to monitor.  

## 2016-02-16 NOTE — Assessment & Plan Note (Signed)
Will continue current regimen and add trulicity. First shot given in the office today. Recheck 3 months.

## 2016-02-17 ENCOUNTER — Encounter: Payer: Self-pay | Admitting: Family Medicine

## 2016-02-17 LAB — COMPREHENSIVE METABOLIC PANEL
ALBUMIN: 4.5 g/dL (ref 3.5–5.5)
ALT: 28 IU/L (ref 0–32)
AST: 23 IU/L (ref 0–40)
Albumin/Globulin Ratio: 2.4 — ABNORMAL HIGH (ref 1.2–2.2)
Alkaline Phosphatase: 69 IU/L (ref 39–117)
BILIRUBIN TOTAL: 0.2 mg/dL (ref 0.0–1.2)
BUN / CREAT RATIO: 17 (ref 9–23)
BUN: 11 mg/dL (ref 6–24)
CHLORIDE: 102 mmol/L (ref 96–106)
CO2: 26 mmol/L (ref 18–29)
Calcium: 9.2 mg/dL (ref 8.7–10.2)
Creatinine, Ser: 0.65 mg/dL (ref 0.57–1.00)
GFR calc Af Amer: 121 mL/min/{1.73_m2} (ref >59)
GFR calc non Af Amer: 105 mL/min/{1.73_m2} (ref >59)
GLUCOSE: 158 mg/dL — AB (ref 65–99)
Globulin, Total: 1.9 g/dL (ref 1.5–4.5)
Potassium: 4.5 mmol/L (ref 3.5–5.2)
SODIUM: 143 mmol/L (ref 134–144)
Total Protein: 6.4 g/dL (ref 6.0–8.5)

## 2016-03-12 ENCOUNTER — Ambulatory Visit (INDEPENDENT_AMBULATORY_CARE_PROVIDER_SITE_OTHER): Payer: BLUE CROSS/BLUE SHIELD | Admitting: Family Medicine

## 2016-03-12 ENCOUNTER — Encounter: Payer: Self-pay | Admitting: Family Medicine

## 2016-03-12 VITALS — BP 134/83 | HR 89 | Temp 98.6°F | Wt 180.0 lb

## 2016-03-12 DIAGNOSIS — R42 Dizziness and giddiness: Secondary | ICD-10-CM

## 2016-03-12 MED ORDER — MECLIZINE HCL 25 MG PO TABS: 25.0000 mg | ORAL_TABLET | Freq: Three times a day (TID) | ORAL | 0 refills | Status: AC | PRN

## 2016-03-12 NOTE — Progress Notes (Signed)
BP 134/83   Pulse 89   Temp 98.6 F (37 C)   Wt 180 lb (81.6 kg)   SpO2 95%   BMI 32.40 kg/m    Subjective:    Patient ID: Kim Andrews, female    DOB: 08-05-1965, 50 y.o.   MRN: [id number]  HPI: Kim Andrews is a 50 y.o. female  Chief Complaint  Patient presents with  . Dizziness    this morning felt dizzy and like she was gonna pass out. Drank some coke thinking her sugar was low, but it didn't help. States it happens off and on but driking Coke usually helps.    Patient presents with dizzy episode earlier today. Has had these intermittently for many years. Usually resolves with drinking some coke. States they usually come on while at work walking around, and she has never actually passed out just feels "woozy". Today's episode did not fully resolve when she sat down and drank a coke. Denies N/V, visual changes, or syncope. Does have poorly controlled DM on several medication. Recently started trulicity, but has not noticed more episodes since starting that.   Relevant past medical, surgical, family and social history reviewed and updated as indicated. Interim medical history since our last visit reviewed. Allergies and medications reviewed and updated.  Review of Systems  Constitutional: Negative.   HENT: Negative.   Respiratory: Negative.   Cardiovascular: Negative.   Gastrointestinal: Negative.   Genitourinary: Negative.   Musculoskeletal: Negative.   Skin: Negative.   Neurological: Positive for dizziness.  Psychiatric/Behavioral: Negative.     Per HPI unless specifically indicated above     Objective:    BP 134/83   Pulse 89   Temp 98.6 F (37 C)   Wt 180 lb (81.6 kg)   SpO2 95%   BMI 32.40 kg/m   Wt Readings from Last 3 Encounters:  03/12/16 180 lb (81.6 kg)  02/16/16 182 lb (82.6 kg)  08/17/15 181 lb (82.1 kg)    Physical Exam  Constitutional: She is oriented to person, place, and time. She appears well-developed and well-nourished.  HENT:    Head: Atraumatic.  Eyes: Conjunctivae are normal. No scleral icterus.  Neck: Normal range of motion. Neck supple.  Cardiovascular: Normal rate and normal heart sounds.   Pulmonary/Chest: Effort normal. No respiratory distress.  Musculoskeletal: Normal range of motion.  Neurological: She is alert and oriented to person, place, and time.  Skin: Skin is warm and dry.  Psychiatric: She has a normal mood and affect. Her behavior is normal.  Nursing note and vitals reviewed.   Results for orders placed or performed in visit on 02/16/16  Bayer DCA Hb A1c Waived  Result Value Ref Range   Bayer DCA Hb A1c Waived 7.5 (H) <7.0 %  Comprehensive metabolic panel  Result Value Ref Range   Glucose 158 (H) 65 - 99 mg/dL   BUN 11 6 - 24 mg/dL   Creatinine, Ser 1.19 0.57 - 1.00 mg/dL   GFR calc non Af Amer 105 >59 mL/min/1.73   GFR calc Af Amer 121 >59 mL/min/1.73   BUN/Creatinine Ratio 17 9 - 23   Sodium 143 134 - 144 mmol/L   Potassium 4.5 3.5 - 5.2 mmol/L   Chloride 102 96 - 106 mmol/L   CO2 26 18 - 29 mmol/L   Calcium 9.2 8.7 - 10.2 mg/dL   Total Protein 6.4 6.0 - 8.5 g/dL   Albumin 4.5 3.5 - 5.5 g/dL   Globulin, Total  1.9 1.5 - 4.5 g/dL   Albumin/Globulin Ratio 2.4 (H) 1.2 - 2.2   Bilirubin Total 0.2 0.0 - 1.2 mg/dL   Alkaline Phosphatase 69 39 - 117 IU/L   AST 23 0 - 40 IU/L   ALT 28 0 - 32 IU/L  Lipid Panel Piccolo, Waived  Result Value Ref Range   Cholesterol Piccolo, Waived 138 <200 mg/dL   HDL Chol Piccolo, Waived 55 (L) >59 mg/dL   Triglycerides Piccolo,Waived 108 <150 mg/dL   Chol/HDL Ratio Piccolo,Waive 2.5 mg/dL   LDL Chol Calc Piccolo Waived 61 <100 mg/dL   VLDL Chol Calc Piccolo,Waive 22 <30 mg/dL      Assessment & Plan:   Problem List Items Addressed This Visit    None    Visit Diagnoses    Dizziness    -  Primary   EKG unchanged from previous and orthostatics stable, discussed low glycemic diet for better sugar stability. Meclizine given to see if any prn  relief.    Relevant Orders   EKG 12-Lead (Completed)       Follow up plan: Return for as scheduled.

## 2016-03-12 NOTE — Patient Instructions (Signed)
Follow up as scheduled.  

## 2016-03-29 LAB — HM DIABETES EYE EXAM

## 2016-05-17 ENCOUNTER — Ambulatory Visit (INDEPENDENT_AMBULATORY_CARE_PROVIDER_SITE_OTHER): Payer: BLUE CROSS/BLUE SHIELD | Admitting: Family Medicine

## 2016-05-17 ENCOUNTER — Encounter: Payer: Self-pay | Admitting: Family Medicine

## 2016-05-17 VITALS — BP 110/74 | HR 90 | Temp 99.0°F | Ht 62.0 in | Wt 179.6 lb

## 2016-05-17 DIAGNOSIS — E1165 Type 2 diabetes mellitus with hyperglycemia: Secondary | ICD-10-CM

## 2016-05-17 DIAGNOSIS — E1129 Type 2 diabetes mellitus with other diabetic kidney complication: Secondary | ICD-10-CM | POA: Diagnosis not present

## 2016-05-17 DIAGNOSIS — R809 Proteinuria, unspecified: Secondary | ICD-10-CM | POA: Diagnosis not present

## 2016-05-17 DIAGNOSIS — H9201 Otalgia, right ear: Secondary | ICD-10-CM

## 2016-05-17 DIAGNOSIS — IMO0002 Reserved for concepts with insufficient information to code with codable children: Secondary | ICD-10-CM

## 2016-05-17 LAB — BAYER DCA HB A1C WAIVED: HB A1C: 7 % — AB (ref <7.0)

## 2016-05-17 MED ORDER — DULAGLUTIDE 0.75 MG/0.5ML ~~LOC~~ SOAJ: 0.7500 mg | SUBCUTANEOUS | 6 refills | Status: DC

## 2016-05-17 MED ORDER — VALSARTAN 80 MG PO TABS: 80.0000 mg | ORAL_TABLET | Freq: Every day | ORAL | 6 refills | Status: DC

## 2016-05-17 MED ORDER — MELOXICAM 15 MG PO TABS: 15.0000 mg | ORAL_TABLET | Freq: Every day | ORAL | 6 refills | Status: DC

## 2016-05-17 NOTE — Progress Notes (Signed)
BP 110/74 (BP Location: Left Arm, Patient Position: Sitting, Cuff Size: Large)   Pulse 90   Temp 99 F (37.2 C)   Ht 5\' 2"  (1.575 m)   Wt 179 lb 9.6 oz (81.5 kg)   SpO2 95%   BMI 32.85 kg/m    Subjective:    Patient ID: Kim Andrews, female    DOB: 1966-03-18, 50 y.o.   MRN: [id number]  HPI: Kim Andrews is a 50 y.o. female  Chief Complaint  Patient presents with  . Diabetes   DIABETES Hypoglycemic episodes:no Polydipsia/polyuria: no Visual disturbance: no Chest pain: no Paresthesias: no Glucose Monitoring: yes  Accucheck frequency: 1x a week  Fasting glucose: 140-150 Taking Insulin?: no Blood Pressure Monitoring: not checking Retinal Examination: Up to Date Foot Exam: Up to Date Diabetic Education: Completed Pneumovax: Up to Date Influenza: Up to Date Aspirin: no   EAR PAIN Duration: 1 week Involved ear(s): right Severity:  mild  Quality:  sharp Fever: no Otorrhea: no Upper respiratory infection symptoms: no Pruritus: no Hearing loss: no Water immersion no Using Q-tips: yes Recurrent otitis media: no Status: stable Treatments attempted: none   Relevant past medical, surgical, family and social history reviewed and updated as indicated. Interim medical history since our last visit reviewed. Allergies and medications reviewed and updated.  Review of Systems  Constitutional: Negative.   HENT: Negative for sneezing, sore throat, tinnitus, trouble swallowing and voice change.   Respiratory: Negative.   Cardiovascular: Negative.   Psychiatric/Behavioral: Negative.     Per HPI unless specifically indicated above     Objective:    BP 110/74 (BP Location: Left Arm, Patient Position: Sitting, Cuff Size: Large)   Pulse 90   Temp 99 F (37.2 C)   Ht 5\' 2"  (1.575 m)   Wt 179 lb 9.6 oz (81.5 kg)   SpO2 95%   BMI 32.85 kg/m   Wt Readings from Last 3 Encounters:  05/17/16 179 lb 9.6 oz (81.5 kg)  03/12/16 180 lb (81.6 kg)  02/16/16 182 lb  (82.6 kg)    Physical Exam  Constitutional: She is oriented to person, place, and time. She appears well-developed and well-nourished. No distress.  HENT:  Head: Normocephalic and atraumatic.  Right Ear: Hearing and external ear normal.  Left Ear: Hearing and external ear normal.  Nose: Nose normal.  Mouth/Throat: Oropharynx is clear and moist. No oropharyngeal exudate.  Small scratch in R EAC  Eyes: Conjunctivae and lids are normal. Right eye exhibits no discharge. Left eye exhibits no discharge. No scleral icterus.  Cardiovascular: Normal rate, regular rhythm, normal heart sounds and intact distal pulses.  Exam reveals no gallop and no friction rub.   No murmur heard. Pulmonary/Chest: Effort normal and breath sounds normal. No respiratory distress. She has no wheezes. She has no rales. She exhibits no tenderness.  Musculoskeletal: Normal range of motion.  Neurological: She is alert and oriented to person, place, and time.  Skin: Skin is warm, dry and intact. No rash noted. She is not diaphoretic. No erythema. No pallor.  Psychiatric: She has a normal mood and affect. Her speech is normal and behavior is normal. Judgment and thought content normal. Cognition and memory are normal.  Nursing note and vitals reviewed.   Results for orders placed or performed in visit on 03/30/16  HM DIABETES EYE EXAM  Result Value Ref Range   HM Diabetic Eye Exam No Retinopathy No Retinopathy      Assessment & Plan:  Problem List Items Addressed This Visit      Endocrine   Uncontrolled type 2 diabetes mellitus with microalbuminuria (HCC) - Primary    A1c down to 7.0. Continue current regimen. Continue to monitor. Recheck 3 months      Relevant Medications   Dulaglutide (TRULICITY) 0.75 MG/0.5ML SOPN   valsartan (DIOVAN) 80 MG tablet   Other Relevant Orders   Bayer DCA Hb A1c Waived    Other Visit Diagnoses    Ear pain, right       Scratch in the EAC- avoid q-tips. Call if not getting  better       Follow up plan: Return in about 3 months (around 08/15/2016) for Physical and DM visit.

## 2016-05-17 NOTE — Assessment & Plan Note (Signed)
A1c down to 7.0. Continue current regimen. Continue to monitor. Recheck 3 months

## 2016-07-02 ENCOUNTER — Encounter: Payer: Self-pay | Admitting: Family Medicine

## 2016-07-02 ENCOUNTER — Ambulatory Visit (INDEPENDENT_AMBULATORY_CARE_PROVIDER_SITE_OTHER): Payer: BLUE CROSS/BLUE SHIELD | Admitting: Family Medicine

## 2016-07-02 VITALS — BP 110/75 | HR 102 | Temp 98.4°F | Wt 182.0 lb

## 2016-07-02 DIAGNOSIS — L03116 Cellulitis of left lower limb: Secondary | ICD-10-CM | POA: Diagnosis not present

## 2016-07-02 MED ORDER — SULFAMETHOXAZOLE-TRIMETHOPRIM 800-160 MG PO TABS: 1.0000 | ORAL_TABLET | Freq: Two times a day (BID) | ORAL | 0 refills | Status: DC

## 2016-07-02 MED ORDER — TRAMADOL HCL 50 MG PO TABS: 50.0000 mg | ORAL_TABLET | Freq: Three times a day (TID) | ORAL | 0 refills | Status: AC | PRN

## 2016-07-02 NOTE — Progress Notes (Signed)
   BP 110/75   Pulse (!) 102   Temp 98.4 F (36.9 C)   Wt 182 lb (82.6 kg)   SpO2 95%   BMI 33.29 kg/m    Subjective:    Patient ID: Kim Andrews, female    DOB: Jan 14, 1966, 51 y.o.   MRN: [id number]  HPI: Kim Andrews is a 51 y.o. female  Chief Complaint  Patient presents with  . Rash    on her left leg x 2-3 days. Redness, itching, warm to the touch. No burning. Thinks its related to her brace.    Patient presents with worsening red, painful, itchy rash surrounding left knee that began about 3 days ago. States something similar happened in the past when she used this particular leg brace that she has recently gotten back into wearing. Denies fever, chills, new injury. Using gold bond and cortisone cream and those didn't help. Has been trying tylenol with no relief and takes meloxicam daily for knee pain but it doesn't seem to be touching the pain from this rash.   Relevant past medical, surgical, family and social history reviewed and updated as indicated. Interim medical history since our last visit reviewed. Allergies and medications reviewed and updated.  Review of Systems  Constitutional: Negative.   HENT: Negative.   Eyes: Negative.   Respiratory: Negative.   Cardiovascular: Negative.   Gastrointestinal: Negative.   Genitourinary: Negative.   Neurological: Negative.   Psychiatric/Behavioral: Negative.     Per HPI unless specifically indicated above     Objective:    BP 110/75   Pulse (!) 102   Temp 98.4 F (36.9 C)   Wt 182 lb (82.6 kg)   SpO2 95%   BMI 33.29 kg/m   Wt Readings from Last 3 Encounters:  07/02/16 182 lb (82.6 kg)  05/17/16 179 lb 9.6 oz (81.5 kg)  03/12/16 180 lb (81.6 kg)    Physical Exam  Constitutional: She is oriented to person, place, and time. She appears well-developed and well-nourished. No distress.  HENT:  Head: Atraumatic.  Eyes: Conjunctivae are normal. Pupils are equal, round, and reactive to light. No scleral icterus.    Neck: Normal range of motion. Neck supple.  Cardiovascular: Normal rate and normal heart sounds.   Pulmonary/Chest: Effort normal and breath sounds normal. No respiratory distress.  Musculoskeletal: Normal range of motion.  Neurological: She is alert and oriented to person, place, and time.  Skin: Skin is warm and dry. There is erythema (Diffuse erythema and warmth surrounding left knee with several places of open abrasion. ).  Psychiatric: She has a normal mood and affect. Her behavior is normal.  Nursing note and vitals reviewed.     Assessment & Plan:   Problem List Items Addressed This Visit    None    Visit Diagnoses    Cellulitis of left lower extremity    -  Primary   D/c current leg brace. Bactrim sent for 7 day course. Apply neosporin twice daily, ice to area for pain relief. Recheck in 7 days. F/u if worsening sooner       Follow up plan: Return in about 1 week (around 07/09/2016) for Wound check.

## 2016-07-02 NOTE — Patient Instructions (Signed)
Follow up in 1 week for recheck 

## 2016-07-09 ENCOUNTER — Ambulatory Visit (INDEPENDENT_AMBULATORY_CARE_PROVIDER_SITE_OTHER): Payer: BLUE CROSS/BLUE SHIELD | Admitting: Family Medicine

## 2016-07-09 ENCOUNTER — Encounter: Payer: Self-pay | Admitting: Family Medicine

## 2016-07-09 VITALS — BP 121/71 | HR 88 | Temp 97.5°F | Wt 184.0 lb

## 2016-07-09 DIAGNOSIS — L03116 Cellulitis of left lower limb: Secondary | ICD-10-CM | POA: Diagnosis not present

## 2016-07-09 NOTE — Patient Instructions (Signed)
Follow up as scheduled for your diabetes check

## 2016-07-09 NOTE — Progress Notes (Signed)
   BP 121/71   Pulse 88   Temp 97.5 F (36.4 C)   Wt 184 lb (83.5 kg)   SpO2 98%   BMI 33.65 kg/m    Subjective:    Patient ID: Kim Andrews, female    DOB: [birth year], 51 y.o.   MRN: [id number]  HPI: Kim Andrews is a 51 y.o. female  Chief Complaint  Patient presents with  . Cellulitis    recheck leg wound, she finished the antibiotic this morning. States it is much better.   Patient presents for 1 week recheck of left leg cellulitis around her knee. Has gotten a new knee brace and completed full course of antibiotics. Using neosporin and elevating leg when at rest. States pain is much improved, and that area is doing much better. Denies fever, chills, aches, or drainage in the area.   Relevant past medical, surgical, family and social history reviewed and updated as indicated. Interim medical history since our last visit reviewed. Allergies and medications reviewed and updated.  Review of Systems  Constitutional: Negative.   HENT: Negative.   Respiratory: Negative.   Cardiovascular: Negative.   Gastrointestinal: Negative.   Genitourinary: Negative.   Musculoskeletal: Negative.   Skin: Positive for color change.  Neurological: Negative.   Psychiatric/Behavioral: Negative.     Per HPI unless specifically indicated above     Objective:    BP 121/71   Pulse 88   Temp 97.5 F (36.4 C)   Wt 184 lb (83.5 kg)   SpO2 98%   BMI 33.65 kg/m   Wt Readings from Last 3 Encounters:  07/09/16 184 lb (83.5 kg)  07/02/16 182 lb (82.6 kg)  05/17/16 179 lb 9.6 oz (81.5 kg)    Physical Exam  Constitutional: She is oriented to person, place, and time. She appears well-developed and well-nourished.  HENT:  Head: Atraumatic.  Eyes: Conjunctivae are normal. Pupils are equal, round, and reactive to light. No scleral icterus.  Neck: Normal range of motion. Neck supple.  Cardiovascular: Normal rate and normal heart sounds.   Pulmonary/Chest: Effort normal and breath sounds  normal. No respiratory distress.  Musculoskeletal: Normal range of motion.  Neurological: She is alert and oriented to person, place, and time.  Skin: Skin is dry. There is erythema (Mild diffuse erythema surrounding left knee, areas of abrasions have been healed, and no more warmth).  Psychiatric: She has a normal mood and affect. Her behavior is normal.  Nursing note and vitals reviewed.     Assessment & Plan:   Problem List Items Addressed This Visit    None    Visit Diagnoses    Left leg cellulitis    -  Primary   Resolving after course of abx. Continue neosporin, ice, elevation, and using new brace. Follow up as scheduled next month, or sooner if area worsens again       Follow up plan: Return for as scheduled.

## 2016-07-11 ENCOUNTER — Ambulatory Visit: Payer: Self-pay | Admitting: Family Medicine

## 2016-08-20 ENCOUNTER — Encounter: Payer: Self-pay | Admitting: Family Medicine

## 2016-08-20 ENCOUNTER — Ambulatory Visit (INDEPENDENT_AMBULATORY_CARE_PROVIDER_SITE_OTHER): Payer: BLUE CROSS/BLUE SHIELD | Admitting: Family Medicine

## 2016-08-20 VITALS — BP 120/77 | HR 93 | Temp 98.4°F | Resp 17 | Ht 62.0 in | Wt 180.0 lb

## 2016-08-20 DIAGNOSIS — E1165 Type 2 diabetes mellitus with hyperglycemia: Secondary | ICD-10-CM | POA: Diagnosis not present

## 2016-08-20 DIAGNOSIS — Z1211 Encounter for screening for malignant neoplasm of colon: Secondary | ICD-10-CM

## 2016-08-20 DIAGNOSIS — E1129 Type 2 diabetes mellitus with other diabetic kidney complication: Secondary | ICD-10-CM | POA: Diagnosis not present

## 2016-08-20 DIAGNOSIS — R809 Proteinuria, unspecified: Secondary | ICD-10-CM | POA: Diagnosis not present

## 2016-08-20 DIAGNOSIS — Z0000 Encounter for general adult medical examination without abnormal findings: Secondary | ICD-10-CM

## 2016-08-20 DIAGNOSIS — Z114 Encounter for screening for human immunodeficiency virus [HIV]: Secondary | ICD-10-CM

## 2016-08-20 DIAGNOSIS — I10 Essential (primary) hypertension: Secondary | ICD-10-CM | POA: Diagnosis not present

## 2016-08-20 DIAGNOSIS — E785 Hyperlipidemia, unspecified: Secondary | ICD-10-CM | POA: Diagnosis not present

## 2016-08-20 DIAGNOSIS — IMO0002 Reserved for concepts with insufficient information to code with codable children: Secondary | ICD-10-CM

## 2016-08-20 LAB — UA/M W/RFLX CULTURE, ROUTINE
BILIRUBIN UA: NEGATIVE
KETONES UA: NEGATIVE
LEUKOCYTES UA: NEGATIVE
Nitrite, UA: NEGATIVE
PROTEIN UA: NEGATIVE
RBC, UA: NEGATIVE
Urobilinogen, Ur: 0.2 mg/dL (ref 0.2–1.0)
pH, UA: 5.5 (ref 5.0–7.5)

## 2016-08-20 LAB — HEMOGLOBIN A1C: HEMOGLOBIN A1C: 7.4

## 2016-08-20 LAB — MICROSCOPIC EXAMINATION
BACTERIA UA: NONE SEEN
RBC MICROSCOPIC, UA: NONE SEEN /HPF (ref 0–?)
WBC UA: NONE SEEN /HPF (ref 0–?)

## 2016-08-20 LAB — MICROALBUMIN, URINE WAIVED
Creatinine, Urine Waived: 50 mg/dL (ref 10–300)
Microalb, Ur Waived: 10 mg/L (ref 0–19)

## 2016-08-20 LAB — BAYER DCA HB A1C WAIVED: HB A1C (BAYER DCA - WAIVED): 7.4 % — ABNORMAL HIGH (ref <7.0)

## 2016-08-20 MED ORDER — SAXAGLIPTIN-METFORMIN ER 2.5-1000 MG PO TB24: 1.0000 | ORAL_TABLET | Freq: Two times a day (BID) | ORAL | 1 refills | Status: DC

## 2016-08-20 MED ORDER — DULAGLUTIDE 1.5 MG/0.5ML ~~LOC~~ SOAJ: 1.5000 mg | SUBCUTANEOUS | 6 refills | Status: DC

## 2016-08-20 MED ORDER — VALSARTAN 80 MG PO TABS: 80.0000 mg | ORAL_TABLET | Freq: Every day | ORAL | 1 refills | Status: DC

## 2016-08-20 MED ORDER — DULAGLUTIDE 0.75 MG/0.5ML ~~LOC~~ SOAJ: 0.7500 mg | SUBCUTANEOUS | 6 refills | Status: DC

## 2016-08-20 MED ORDER — SIMVASTATIN 20 MG PO TABS: 20.0000 mg | ORAL_TABLET | Freq: Every day | ORAL | 1 refills | Status: DC

## 2016-08-20 MED ORDER — MELOXICAM 15 MG PO TABS: 15.0000 mg | ORAL_TABLET | Freq: Every day | ORAL | 1 refills | Status: DC

## 2016-08-20 NOTE — Patient Instructions (Addendum)
Health Maintenance, Female Adopting a healthy lifestyle and getting preventive care can go a long way to promote health and wellness. Talk with your health care provider about what schedule of regular examinations is right for you. This is a good chance for you to check in with your provider about disease prevention and staying healthy. In between checkups, there are plenty of things you can do on your own. Experts have done a lot of research about which lifestyle changes and preventive measures are most likely to keep you healthy. Ask your health care provider for more information. Weight and diet Eat a healthy diet  Be sure to include plenty of vegetables, fruits, low-fat dairy products, and lean protein.  Do not eat a lot of foods high in solid fats, added sugars, or salt.  Get regular exercise. This is one of the most important things you can do for your health.  Most adults should exercise for at least 150 minutes each week. The exercise should increase your heart rate and make you sweat (moderate-intensity exercise).  Most adults should also do strengthening exercises at least twice a week. This is in addition to the moderate-intensity exercise. Maintain a healthy weight  Body mass index (BMI) is a measurement that can be used to identify possible weight problems. It estimates body fat based on height and weight. Your health care provider can help determine your BMI and help you achieve or maintain a healthy weight.  For females 50 years of age and older:  A BMI below 18.5 is considered underweight.  A BMI of 18.5 to 24.9 is normal.  A BMI of 25 to 29.9 is considered overweight.  A BMI of 30 and above is considered obese. Watch levels of cholesterol and blood lipids  You should start having your blood tested for lipids and cholesterol at 50 years of age, then have this test every 5 years.  You may need to have your cholesterol levels checked more often if:  Your lipid or  cholesterol levels are high.  You are older than 50 years of age.  You are at high risk for heart disease. Cancer screening Lung Cancer  Lung cancer screening is recommended for adults 50-42 years old who are at high risk for lung cancer because of a history of smoking.  A yearly low-dose CT scan of the lungs is recommended for people who:  Currently smoke.  Have quit within the past 15 years.  Have at least a 30-pack-year history of smoking. A pack year is smoking an average of one pack of cigarettes a day for 1 year.  Yearly screening should continue until it has been 15 years since you quit.  Yearly screening should stop if you develop a health problem that would prevent you from having lung cancer treatment. Breast Cancer  Practice breast self-awareness. This means understanding how your breasts normally appear and feel.  It also means doing regular breast self-exams. Let your health care provider know about any changes, no matter how small.  If you are in your 20s or 30s, you should have a clinical breast exam (CBE) by a health care provider every 1-3 years as part of a regular health exam.  If you are 34 or older, have a CBE every year. Also consider having a breast X-ray (mammogram) every year.  If you have a family history of breast cancer, talk to your health care provider about genetic screening.  If you are at high risk for breast cancer, talk  to your health care provider about having an MRI and a mammogram every year.  Breast cancer gene (BRCA) assessment is recommended for women who have family members with BRCA-related cancers. BRCA-related cancers include:  Breast.  Ovarian.  Tubal.  Peritoneal cancers.  Results of the assessment will determine the need for genetic counseling and BRCA1 and BRCA2 testing. Cervical Cancer  Your health care provider may recommend that you be screened regularly for cancer of the pelvic organs (ovaries, uterus, and vagina).  This screening involves a pelvic examination, including checking for microscopic changes to the surface of your cervix (Pap test). You may be encouraged to have this screening done every 3 years, beginning at age 24.  For women ages 66-65, health care providers may recommend pelvic exams and Pap testing every 3 years, or they may recommend the Pap and pelvic exam, combined with testing for human papilloma virus (HPV), every 5 years. Some types of HPV increase your risk of cervical cancer. Testing for HPV may also be done on women of any age with unclear Pap test results.  Other health care providers may not recommend any screening for nonpregnant women who are considered low risk for pelvic cancer and who do not have symptoms. Ask your health care provider if a screening pelvic exam is right for you.  If you have had past treatment for cervical cancer or a condition that could lead to cancer, you need Pap tests and screening for cancer for at least 20 years after your treatment. If Pap tests have been discontinued, your risk factors (such as having a new sexual partner) need to be reassessed to determine if screening should resume. Some women have medical problems that increase the chance of getting cervical cancer. In these cases, your health care provider may recommend more frequent screening and Pap tests. Colorectal Cancer  This type of cancer can be detected and often prevented.  Routine colorectal cancer screening usually begins at 50 years of age and continues through 50 years of age.  Your health care provider may recommend screening at an earlier age if you have risk factors for colon cancer.  Your health care provider may also recommend using home test kits to check for hidden blood in the stool.  A small camera at the end of a tube can be used to examine your colon directly (sigmoidoscopy or colonoscopy). This is done to check for the earliest forms of colorectal cancer.  Routine  screening usually begins at age 50.  Direct examination of the colon should be repeated every 5-10 years through 50 years of age. However, you may need to be screened more often if early forms of precancerous polyps or small growths are found. Skin Cancer  Check your skin from head to toe regularly.  Tell your health care provider about any new moles or changes in moles, especially if there is a change in a mole's shape or color.  Also tell your health care provider if you have a mole that is larger than the size of a pencil eraser.  Always use sunscreen. Apply sunscreen liberally and repeatedly throughout the day.  Protect yourself by wearing long sleeves, pants, a wide-brimmed hat, and sunglasses whenever you are outside. Heart disease, diabetes, and high blood pressure  High blood pressure causes heart disease and increases the risk of stroke. High blood pressure is more likely to develop in:  People who have blood pressure in the high end of the normal range (130-139/85-89 mm Hg).  People who are overweight or obese.  People who are African American.  If you are 59-24 years of age, have your blood pressure checked every 3-5 years. If you are 34 years of age or older, have your blood pressure checked every year. You should have your blood pressure measured twice-once when you are at a hospital or clinic, and once when you are not at a hospital or clinic. Record the average of the two measurements. To check your blood pressure when you are not at a hospital or clinic, you can use:  An automated blood pressure machine at a pharmacy.  A home blood pressure monitor.  If you are between 29 years and 60 years old, ask your health care provider if you should take aspirin to prevent strokes.  Have regular diabetes screenings. This involves taking a blood sample to check your fasting blood sugar level.  If you are at a normal weight and have a low risk for diabetes, have this test once  every three years after 50 years of age.  If you are overweight and have a high risk for diabetes, consider being tested at a younger age or more often. Preventing infection Hepatitis B  If you have a higher risk for hepatitis B, you should be screened for this virus. You are considered at high risk for hepatitis B if:  You were born in a country where hepatitis B is common. Ask your health care provider which countries are considered high risk.  Your parents were born in a high-risk country, and you have not been immunized against hepatitis B (hepatitis B vaccine).  You have HIV or AIDS.  You use needles to inject street drugs.  You live with someone who has hepatitis B.  You have had sex with someone who has hepatitis B.  You get hemodialysis treatment.  You take certain medicines for conditions, including cancer, organ transplantation, and autoimmune conditions. Hepatitis C  Blood testing is recommended for:  Everyone born from [birth year] through 1964.  Anyone with known risk factors for hepatitis C. Sexually transmitted infections (STIs)  You should be screened for sexually transmitted infections (STIs) including gonorrhea and chlamydia if:  You are sexually active and are younger than 50 years of age.  You are older than 50 years of age and your health care provider tells you that you are at risk for this type of infection.  Your sexual activity has changed since you were last screened and you are at an increased risk for chlamydia or gonorrhea. Ask your health care provider if you are at risk.  If you do not have HIV, but are at risk, it may be recommended that you take a prescription medicine daily to prevent HIV infection. This is called pre-exposure prophylaxis (PrEP). You are considered at risk if:  You are sexually active and do not regularly use condoms or know the HIV status of your partner(s).  You take drugs by injection.  You are sexually active with a partner  who has HIV. Talk with your health care provider about whether you are at high risk of being infected with HIV. If you choose to begin PrEP, you should first be tested for HIV. You should then be tested every 3 months for as long as you are taking PrEP. Pregnancy  If you are premenopausal and you may become pregnant, ask your health care provider about preconception counseling.  If you may become pregnant, take 400 to 800 micrograms (mcg) of folic acid  every day.  If you want to prevent pregnancy, talk to your health care provider about birth control (contraception). Osteoporosis and menopause  Osteoporosis is a disease in which the bones lose minerals and strength with aging. This can result in serious bone fractures. Your risk for osteoporosis can be identified using a bone density scan.  If you are 63 years of age or older, or if you are at risk for osteoporosis and fractures, ask your health care provider if you should be screened.  Ask your health care provider whether you should take a calcium or vitamin D supplement to lower your risk for osteoporosis.  Menopause may have certain physical symptoms and risks.  Hormone replacement therapy may reduce some of these symptoms and risks. Talk to your health care provider about whether hormone replacement therapy is right for you. Follow these instructions at home:  Schedule regular health, dental, and eye exams.  Stay current with your immunizations.  Do not use any tobacco products including cigarettes, chewing tobacco, or electronic cigarettes.  If you are pregnant, do not drink alcohol.  If you are breastfeeding, limit how much and how often you drink alcohol.  Limit alcohol intake to no more than 1 drink per day for nonpregnant women. One drink equals 12 ounces of beer, 5 ounces of wine, or 1 ounces of hard liquor.  Do not use street drugs.  Do not share needles.  Ask your health care provider for help if you need support  or information about quitting drugs.  Tell your health care provider if you often feel depressed.  Tell your health care provider if you have ever been abused or do not feel safe at home. This information is not intended to replace advice given to you by your health care provider. Make sure you discuss any questions you have with your health care provider. Document Released: 12/04/2010 Document Revised: 10/27/2015 Document Reviewed: 02/22/2015 Elsevier Interactive Patient Education  2017 [place] Maintenance for Postmenopausal Women Menopause is a normal process in which your reproductive ability comes to an end. This process happens gradually over a span of months to years, usually between the ages of 48 and 29. Menopause is complete when you have missed 12 consecutive menstrual periods. It is important to talk with your health care provider about some of the most common conditions that affect postmenopausal women, such as heart disease, cancer, and bone loss (osteoporosis). Adopting a healthy lifestyle and getting preventive care can help to promote your health and wellness. Those actions can also lower your chances of developing some of these common conditions. What should I know about menopause? During menopause, you may experience a number of symptoms, such as:  Moderate-to-severe hot flashes.  Night sweats.  Decrease in sex drive.  Mood swings.  Headaches.  Tiredness.  Irritability.  Memory problems.  Insomnia. Choosing to treat or not to treat menopausal changes is an individual decision that you make with your health care provider. What should I know about hormone replacement therapy and supplements? Hormone therapy products are effective for treating symptoms that are associated with menopause, such as hot flashes and night sweats. Hormone replacement carries certain risks, especially as you become older. If you are thinking about using estrogen or estrogen  with progestin treatments, discuss the benefits and risks with your health care provider. What should I know about heart disease and stroke? Heart disease, heart attack, and stroke become more likely as you age. This may be due, in  part, to the hormonal changes that your body experiences during menopause. These can affect how your body processes dietary fats, triglycerides, and cholesterol. Heart attack and stroke are both medical emergencies. There are many things that you can do to help prevent heart disease and stroke:  Have your blood pressure checked at least every 1-2 years. High blood pressure causes heart disease and increases the risk of stroke.  If you are 54-61 years old, ask your health care provider if you should take aspirin to prevent a heart attack or a stroke.  Do not use any tobacco products, including cigarettes, chewing tobacco, or electronic cigarettes. If you need help quitting, ask your health care provider.  It is important to eat a healthy diet and maintain a healthy weight.  Be sure to include plenty of vegetables, fruits, low-fat dairy products, and lean protein.  Avoid eating foods that are high in solid fats, added sugars, or salt (sodium).  Get regular exercise. This is one of the most important things that you can do for your health.  Try to exercise for at least 150 minutes each week. The type of exercise that you do should increase your heart rate and make you sweat. This is known as moderate-intensity exercise.  Try to do strengthening exercises at least twice each week. Do these in addition to the moderate-intensity exercise.  Know your numbers.Ask your health care provider to check your cholesterol and your blood glucose. Continue to have your blood tested as directed by your health care provider. What should I know about cancer screening? There are several types of cancer. Take the following steps to reduce your risk and to catch any cancer development  as early as possible. Breast Cancer  Practice breast self-awareness.  This means understanding how your breasts normally appear and feel.  It also means doing regular breast self-exams. Let your health care provider know about any changes, no matter how small.  If you are 63 or older, have a clinician do a breast exam (clinical breast exam or CBE) every year. Depending on your age, family history, and medical history, it may be recommended that you also have a yearly breast X-ray (mammogram).  If you have a family history of breast cancer, talk with your health care provider about genetic screening.  If you are at high risk for breast cancer, talk with your health care provider about having an MRI and a mammogram every year.  Breast cancer (BRCA) gene test is recommended for women who have family members with BRCA-related cancers. Results of the assessment will determine the need for genetic counseling and BRCA1 and for BRCA2 testing. BRCA-related cancers include these types:  Breast. This occurs in males or females.  Ovarian.  Tubal. This may also be called fallopian tube cancer.  Cancer of the abdominal or pelvic lining (peritoneal cancer).  Prostate.  Pancreatic. Cervical, Uterine, and Ovarian Cancer  Your health care provider may recommend that you be screened regularly for cancer of the pelvic organs. These include your ovaries, uterus, and vagina. This screening involves a pelvic exam, which includes checking for microscopic changes to the surface of your cervix (Pap test).  For women ages 21-65, health care providers may recommend a pelvic exam and a Pap test every three years. For women ages 61-65, they may recommend the Pap test and pelvic exam, combined with testing for human papilloma virus (HPV), every five years. Some types of HPV increase your risk of cervical cancer. Testing for HPV may  also be done on women of any age who have unclear Pap test results.  Other health  care providers may not recommend any screening for nonpregnant women who are considered low risk for pelvic cancer and have no symptoms. Ask your health care provider if a screening pelvic exam is right for you.  If you have had past treatment for cervical cancer or a condition that could lead to cancer, you need Pap tests and screening for cancer for at least 20 years after your treatment. If Pap tests have been discontinued for you, your risk factors (such as having a new sexual partner) need to be reassessed to determine if you should start having screenings again. Some women have medical problems that increase the chance of getting cervical cancer. In these cases, your health care provider may recommend that you have screening and Pap tests more often.  If you have a family history of uterine cancer or ovarian cancer, talk with your health care provider about genetic screening.  If you have vaginal bleeding after reaching menopause, tell your health care provider.  There are currently no reliable tests available to screen for ovarian cancer. Lung Cancer  Lung cancer screening is recommended for adults 61-47 years old who are at high risk for lung cancer because of a history of smoking. A yearly low-dose CT scan of the lungs is recommended if you:  Currently smoke.  Have a history of at least 30 pack-years of smoking and you currently smoke or have quit within the past 15 years. A pack-year is smoking an average of one pack of cigarettes per day for one year. Yearly screening should:  Continue until it has been 15 years since you quit.  Stop if you develop a health problem that would prevent you from having lung cancer treatment. Colorectal Cancer  This type of cancer can be detected and can often be prevented.  Routine colorectal cancer screening usually begins at age 2 and continues through age 50.  If you have risk factors for colon cancer, your health care provider may recommend  that you be screened at an earlier age.  If you have a family history of colorectal cancer, talk with your health care provider about genetic screening.  Your health care provider may also recommend using home test kits to check for hidden blood in your stool.  A small camera at the end of a tube can be used to examine your colon directly (sigmoidoscopy or colonoscopy). This is done to check for the earliest forms of colorectal cancer.  Direct examination of the colon should be repeated every 5-10 years until age 54. However, if early forms of precancerous polyps or small growths are found or if you have a family history or genetic risk for colorectal cancer, you may need to be screened more often. Skin Cancer  Check your skin from head to toe regularly.  Monitor any moles. Be sure to tell your health care provider:  About any new moles or changes in moles, especially if there is a change in a mole's shape or color.  If you have a mole that is larger than the size of a pencil eraser.  If any of your family members has a history of skin cancer, especially at a young age, talk with your health care provider about genetic screening.  Always use sunscreen. Apply sunscreen liberally and repeatedly throughout the day.  Whenever you are outside, protect yourself by wearing long sleeves, pants, a wide-brimmed hat, and  sunglasses. What should I know about osteoporosis? Osteoporosis is a condition in which bone destruction happens more quickly than new bone creation. After menopause, you may be at an increased risk for osteoporosis. To help prevent osteoporosis or the bone fractures that can happen because of osteoporosis, the following is recommended:  If you are 57-51 years old, get at least 1,000 mg of calcium and at least 600 mg of vitamin D per day.  If you are older than age 26 but younger than age 4, get at least 1,200 mg of calcium and at least 600 mg of vitamin D per day.  If you are  older than age 48, get at least 1,200 mg of calcium and at least 800 mg of vitamin D per day. Smoking and excessive alcohol intake increase the risk of osteoporosis. Eat foods that are rich in calcium and vitamin D, and do weight-bearing exercises several times each week as directed by your health care provider. What should I know about how menopause affects my mental health? Depression may occur at any age, but it is more common as you become older. Common symptoms of depression include:  Low or sad mood.  Changes in sleep patterns.  Changes in appetite or eating patterns.  Feeling an overall lack of motivation or enjoyment of activities that you previously enjoyed.  Frequent crying spells. Talk with your health care provider if you think that you are experiencing depression. What should I know about immunizations? It is important that you get and maintain your immunizations. These include:  Tetanus, diphtheria, and pertussis (Tdap) booster vaccine.  Influenza every year before the flu season begins.  Pneumonia vaccine.  Shingles vaccine. Your health care provider may also recommend other immunizations. This information is not intended to replace advice given to you by your health care provider. Make sure you discuss any questions you have with your health care provider. Document Released: 07/13/2005 Document Revised: 12/09/2015 Document Reviewed: 02/22/2015 Elsevier Interactive Patient Education  2017 [place].  Colonoscopy, Adult A colonoscopy is an exam to look at the large intestine. It is done to check for problems, such as:  Lumps (tumors).  Growths (polyps).  Swelling (inflammation).  Bleeding. What happens before the procedure? Eating and drinking  Follow instructions from your doctor about eating and drinking. These instructions may include:  A few days before the procedure - follow a low-fiber diet.  Avoid nuts.  Avoid seeds.  Avoid dried  fruit.  Avoid raw fruits.  Avoid vegetables.  1-3 days before the procedure - follow a clear liquid diet. Avoid liquids that have red or purple dye. Drink only clear liquids, such as:  Clear broth or bouillon.  Black coffee or tea.  Clear juice.  Clear soft drinks or sports drinks.  Gelatin dessert.  Popsicles.  On the day of the procedure - do not eat or drink anything during the 2 hours before the procedure. Bowel prep  If you were prescribed an oral bowel prep:  Take it as told by your doctor. Starting the day before your procedure, you will need to drink a lot of liquid. The liquid will cause you to poop (have bowel movements) until your poop is almost clear or light green.  If your skin or butt gets irritated from diarrhea, you may:  Wipe the area with wipes that have medicine in them, such as adult wet wipes with aloe and vitamin E.  Put something on your skin that soothes the area, such as petroleum  jelly.  If you throw up (vomit) while drinking the bowel prep, take a break for up to 60 minutes. Then begin the bowel prep again. If you keep throwing up and you cannot take the bowel prep without throwing up, call your doctor. General instructions   Ask your doctor about changing or stopping your normal medicines. This is important if you take diabetes medicines or blood thinners.  Plan to have someone take you home from the hospital or clinic. What happens during the procedure?  An IV tube may be put into one of your veins.  You will be given medicine to help you relax (sedative).  To reduce your risk of infection:  Your doctors will wash their hands.  Your anal area will be washed with soap.  You will be asked to lie on your side with your knees bent.  Your doctor will get a long, thin, flexible tube ready. The tube will have a camera and a light on the end.  The tube will be put into your anus.  The tube will be gently put into your large  intestine.  Air will be delivered into your large intestine to keep it open. You may feel some pressure or cramping.  The camera will be used to take photos.  A small tissue sample may be removed from your body to be looked at under a microscope (biopsy). If any possible problems are found, the tissue will be sent to a lab for testing.  If small growths are found, your doctor may remove them and have them checked for cancer.  The tube that was put into your anus will be slowly removed. The procedure may vary among doctors and hospitals. What happens after the procedure?  Your doctor will check on you often until the medicines you were given have worn off.  Do not drive for 24 hours after the procedure.  You may have a small amount of blood in your poop.  You may pass gas.  You may have mild cramps or bloating in your belly (abdomen).  It is up to you to get the results of your procedure. Ask your doctor, or the department performing the procedure, when your results will be ready. This information is not intended to replace advice given to you by your health care provider. Make sure you discuss any questions you have with your health care provider. Document Released: 06/23/2010 Document Revised: 03/21/2016 Document Reviewed: 08/02/2015 Elsevier Interactive Patient Education  2017 [employer].

## 2016-08-20 NOTE — Assessment & Plan Note (Signed)
Under good control. Continue current regimen. Continue to monitor. Refills given.  

## 2016-08-20 NOTE — Progress Notes (Signed)
BP 120/77 (BP Location: Left Arm, Patient Position: Sitting, Cuff Size: Normal)   Pulse 93   Temp 98.4 F (36.9 C) (Oral)   Resp 17   Ht 5\' 2"  (1.575 m)   Wt 180 lb (81.6 kg)   SpO2 95%   BMI 32.92 kg/m    Subjective:    Patient ID: Kim Andrews, female    DOB: 10-09-1965, 51 y.o.   MRN: [id number]  HPI: Kim Andrews is a 51 y.o. female presenting on 08/20/2016 for comprehensive medical examination. Current medical complaints include:  DIABETES Hypoglycemic episodes:no Polydipsia/polyuria: no Visual disturbance: no Chest pain: no Paresthesias: no Glucose Monitoring: yes  Accucheck frequency: Daily  Fasting glucose: 180s Taking Insulin?: no Blood Pressure Monitoring: not checking Retinal Examination: Up to Date Foot Exam: Up to Date Diabetic Education: Completed Pneumovax: Up to Date Influenza: Not up to Date Aspirin: yes  HYPERTENSION / HYPERLIPIDEMIA Satisfied with current treatment? yes Duration of hypertension: chronic BP monitoring frequency: not checking BP medication side effects: no Duration of hyperlipidemia: chronic Cholesterol medication side effects: no Cholesterol supplements: fish oil Past cholesterol medications: simvastatin (zocor) Medication compliance: excellent compliance Aspirin: yes Recent stressors: no Recurrent headaches: no Visual changes: no Palpitations: no Dyspnea: no Chest pain: no Lower extremity edema: no Dizzy/lightheaded: no  She currently lives with: husband Menopausal Symptoms: no  Depression Screen done today and results listed below:  Depression screen [facility] 2/9 08/20/2016  Decreased Interest 0  Down, Depressed, Hopeless 0  PHQ - 2 Score 0    Past Medical History:  Past Medical History:  Diagnosis Date  . Chronic kidney disease   . Diabetes mellitus without complication (HCC)   . Hyperlipidemia   . Seizures [facility])     Surgical History:  Past Surgical History:  Procedure Laterality Date  . ABDOMINAL  HYSTERECTOMY     ovaries remain, due to heavy bleeding  . KNEE SURGERY      Medications:  Current Outpatient Prescriptions on File Prior to Visit  Medication Sig  . aspirin EC 81 MG tablet Take 81 mg by mouth daily.  . Calcium Carbonate-Vitamin D 600-400 MG-UNIT per tablet Take 1 tablet by mouth 2 (two) times daily.   . carvedilol (COREG) 3.125 MG tablet Take 3.125 mg by mouth 2 (two) times daily with a meal.  . levETIRAcetam (KEPPRA) 1000 MG tablet Take 1,000 mg by mouth 2 (two) times daily.  . meclizine (ANTIVERT) 25 MG tablet Take 1 tablet (25 mg total) by mouth 3 (three) times daily as needed for dizziness.  . ranitidine (ZANTAC) 300 MG tablet Take 300 mg by mouth 2 (two) times daily.  . traMADol (ULTRAM) 50 MG tablet Take 1 tablet (50 mg total) by mouth every 8 (eight) hours as needed. (Patient not taking: Reported on 08/20/2016)   No current facility-administered medications on file prior to visit.     Allergies:  Allergies  Allergen Reactions  . Dilantin [Phenytoin Sodium Extended]   . Tegretol [Carbamazepine]     Social History:  Social History   Social History  . Marital status: Single    Spouse name: N/A  . Number of children: N/A  . Years of education: N/A   Occupational History  . Not on file.   Social History Main Topics  . Smoking status: Never Smoker  . Smokeless tobacco: Never Used  . Alcohol use No  . Drug use: No  . Sexual activity: Yes    Birth control/ protection: Surgical  Other Topics Concern  . Not on file   Social History Narrative  . No narrative on file   History  Smoking Status  . Never Smoker  Smokeless Tobacco  . Never Used   History  Alcohol Use No    Family History:  Family History  Problem Relation Age of Onset  . Diabetes Mother   . Heart disease Mother   . Diabetes Father   . Heart disease Father   . Seizures Brother   . Hypertension Paternal Grandfather     Past medical history, surgical history, medications,  allergies, family history and social history reviewed with patient today and changes made to appropriate areas of the chart.   Review of Systems  Constitutional: Negative.   HENT: Negative.   Eyes: Negative.   Respiratory: Negative.   Cardiovascular: Negative.   Gastrointestinal: Negative.   Genitourinary: Positive for frequency. Negative for dysuria, flank pain, hematuria and urgency.  Musculoskeletal: Negative.   Skin: Negative.   Neurological: Negative.   Endo/Heme/Allergies: Negative for environmental allergies and polydipsia. Bruises/bleeds easily.  Psychiatric/Behavioral: Negative.     All other ROS negative except what is listed above and in the HPI.      Objective:    BP 120/77 (BP Location: Left Arm, Patient Position: Sitting, Cuff Size: Normal)   Pulse 93   Temp 98.4 F (36.9 C) (Oral)   Resp 17   Ht 5\' 2"  (1.575 m)   Wt 180 lb (81.6 kg)   SpO2 95%   BMI 32.92 kg/m   Wt Readings from Last 3 Encounters:  08/20/16 180 lb (81.6 kg)  07/09/16 184 lb (83.5 kg)  07/02/16 182 lb (82.6 kg)    Physical Exam  Constitutional: She is oriented to person, place, and time. She appears well-developed and well-nourished. No distress.  HENT:  Head: Normocephalic and atraumatic.  Right Ear: Hearing, tympanic membrane, external ear and ear canal normal.  Left Ear: Hearing, tympanic membrane, external ear and ear canal normal.  Nose: Nose normal.  Mouth/Throat: Uvula is midline, oropharynx is clear and moist and mucous membranes are normal. No oropharyngeal exudate.  Eyes: Conjunctivae, EOM and lids are normal. Pupils are equal, round, and reactive to light. Right eye exhibits no discharge. Left eye exhibits no discharge. No scleral icterus.  Neck: Normal range of motion. Neck supple. No JVD present. No tracheal deviation present. No thyromegaly present.  Cardiovascular: Normal rate, regular rhythm, normal heart sounds and intact distal pulses.  Exam reveals no gallop and no  friction rub.   No murmur heard. Pulmonary/Chest: Effort normal and breath sounds normal. No stridor. No respiratory distress. She has no wheezes. She has no rales. She exhibits no tenderness. Right breast exhibits no inverted nipple, no mass, no nipple discharge, no skin change and no tenderness. Left breast exhibits no inverted nipple, no mass, no nipple discharge, no skin change and no tenderness. Breasts are symmetrical.  Abdominal: Soft. Bowel sounds are normal. She exhibits no distension and no mass. There is no tenderness. There is no rebound and no guarding.  Genitourinary:  Genitourinary Comments: Pelvic exam deferred with shared decision making  Musculoskeletal: Normal range of motion. She exhibits no edema, tenderness or deformity.  Lymphadenopathy:    She has no cervical adenopathy.  Neurological: She is alert and oriented to person, place, and time. She has normal reflexes. She displays normal reflexes. No cranial nerve deficit. She exhibits normal muscle tone. Coordination normal.  Skin: Skin is warm, dry and intact. No  rash noted. She is not diaphoretic. No erythema. No pallor.  Psychiatric: She has a normal mood and affect. Her speech is normal and behavior is normal. Judgment and thought content normal. Cognition and memory are normal.  Nursing note and vitals reviewed.  Diabetic Foot Exam - Simple   Simple Foot Form Diabetic Foot exam was performed with the following findings:  Yes 08/20/2016  8:50 AM  Visual Inspection No deformities, no ulcerations, no other skin breakdown bilaterally:  Yes Sensation Testing Intact to touch and monofilament testing bilaterally:  Yes Pulse Check Posterior Tibialis and Dorsalis pulse intact bilaterally:  Yes Comments      Results for orders placed or performed in visit on 08/20/16  Hemoglobin A1c  Result Value Ref Range   Hemoglobin A1C 7.4       Assessment & Plan:   Problem List Items Addressed This Visit      Cardiovascular  and Mediastinum   Benign hypertension    Under good control. Continue current regimen. Continue to monitor. Refills given.      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 Call with any concerns.       Relevant Medications   valsartan (DIOVAN) 80 MG tablet   simvastatin (ZOCOR) 20 MG tablet   Saxagliptin-Metformin 2.10-998 MG TB24   Dulaglutide (TRULICITY) 1.5 MG/0.5ML SOPN   Other Relevant Orders   CBC with Differential/Platelet   Comprehensive metabolic panel   UA/M w/rflx Culture, Routine   Microalbumin, Urine Waived   Bayer DCA Hb A1c Waived     Other   Hyperlipemia    Under good control. Continue current regimen. Continue to monitor. Refills given.      Relevant Medications   valsartan (DIOVAN) 80 MG tablet   simvastatin (ZOCOR) 20 MG tablet   Other Relevant Orders   CBC with Differential/Platelet   Comprehensive metabolic panel   Lipid panel   UA/M w/rflx Culture, Routine   Microalbumin, Urine Waived    Other Visit Diagnoses    Routine general medical examination at a health care facility    -  Primary   Vaccines up to date. Screening labs checked today. Pap N/A. Colonoscopy ordered. Mammogram ordered. Continue diet and exercise.    Relevant Orders   CBC with Differential/Platelet   Comprehensive metabolic panel   Lipid panel   TSH   UA/M w/rflx Culture, Routine   HIV antibody (with reflex)   Ambulatory referral to Gastroenterology   Microalbumin, Urine Waived   Screening for HIV without presence of risk factors       Ordered today. Await results.    Relevant Orders   HIV antibody (with reflex)    Colon cancer screening       Colonoscopy ordered today.   Relevant Orders   Ambulatory referral to Gastroenterology       Follow up plan: Return in about 3 months (around 11/20/2016) for Diabetes visit.   LABORATORY TESTING:  - Pap smear: not applicable  IMMUNIZATIONS:   - Tdap: Tetanus vaccination status reviewed: last tetanus booster within 10 years. - Influenza: Refused - Pneumovax: Up to  date - Zostavax vaccine: Refused  SCREENING: -Mammogram: Ordered today  - Colonoscopy: Ordered today   PATIENT COUNSELING:   Advised to take 1 mg of folate supplement per day if capable of pregnancy.   Sexuality: Discussed sexually transmitted diseases, partner selection, use of condoms, avoidance of unintended pregnancy  and contraceptive alternatives.   Advised to avoid cigarette smoking.  I discussed with the patient that most people either abstain from alcohol or drink within safe limits (<=14/week and <=4 drinks/occasion for males, <=7/weeks and <= 3 drinks/occasion for females) and that the risk for alcohol disorders and other health effects rises proportionally with the number of drinks per week and how often a drinker exceeds daily limits.  Discussed cessation/primary prevention of drug use and availability of treatment for abuse.   Diet: Encouraged to adjust caloric intake to maintain  or achieve ideal body weight, to reduce intake of dietary saturated fat and total fat, to limit sodium intake by avoiding high sodium foods and not adding table salt, and to maintain adequate dietary potassium and calcium preferably from fresh fruits, vegetables, and low-fat dairy products.    stressed the importance of regular exercise  Injury prevention: Discussed safety belts, safety helmets, smoke detector, smoking near bedding or upholstery.   Dental health: Discussed importance of regular tooth brushing, flossing, and dental visits.    NEXT PREVENTATIVE PHYSICAL DUE IN 1 YEAR. Return in  about 3 months (around 11/20/2016) for Diabetes visit.

## 2016-08-20 NOTE — Assessment & Plan Note (Signed)
A1c up to 7.4- will increase her trulicity to 1.5mg  and recheck in 3 months. Call with any concerns.

## 2016-08-21 ENCOUNTER — Encounter: Payer: Self-pay | Admitting: Family Medicine

## 2016-08-21 LAB — CBC WITH DIFFERENTIAL/PLATELET
Basophils Absolute: 0 10*3/uL (ref 0.0–0.2)
Basos: 0 %
EOS (ABSOLUTE): 0.3 10*3/uL (ref 0.0–0.4)
EOS: 3 %
HEMATOCRIT: 38.9 % (ref 34.0–46.6)
HEMOGLOBIN: 12.3 g/dL (ref 11.1–15.9)
Immature Grans (Abs): 0 10*3/uL (ref 0.0–0.1)
Immature Granulocytes: 0 %
LYMPHS ABS: 2.7 10*3/uL (ref 0.7–3.1)
Lymphs: 33 %
MCH: 29.5 pg (ref 26.6–33.0)
MCHC: 31.6 g/dL (ref 31.5–35.7)
MCV: 93 fL (ref 79–97)
MONOS ABS: 0.4 10*3/uL (ref 0.1–0.9)
Monocytes: 5 %
NEUTROS ABS: 4.8 10*3/uL (ref 1.4–7.0)
Neutrophils: 59 %
Platelets: 326 10*3/uL (ref 150–379)
RBC: 4.17 x10E6/uL (ref 3.77–5.28)
RDW: 13.8 % (ref 12.3–15.4)
WBC: 8.3 10*3/uL (ref 3.4–10.8)

## 2016-08-21 LAB — COMPREHENSIVE METABOLIC PANEL
A/G RATIO: 2.1 (ref 1.2–2.2)
ALBUMIN: 4.2 g/dL (ref 3.5–5.5)
ALK PHOS: 86 IU/L (ref 39–117)
ALT: 28 IU/L (ref 0–32)
AST: 20 IU/L (ref 0–40)
BUN / CREAT RATIO: 16 (ref 9–23)
BUN: 9 mg/dL (ref 6–24)
CHLORIDE: 102 mmol/L (ref 96–106)
CO2: 28 mmol/L (ref 18–29)
Calcium: 8.9 mg/dL (ref 8.7–10.2)
Creatinine, Ser: 0.57 mg/dL (ref 0.57–1.00)
GFR calc Af Amer: 125 mL/min/{1.73_m2} (ref >59)
GFR calc non Af Amer: 108 mL/min/{1.73_m2} (ref >59)
GLOBULIN, TOTAL: 2 g/dL (ref 1.5–4.5)
Glucose: 185 mg/dL — ABNORMAL HIGH (ref 65–99)
Potassium: 4.2 mmol/L (ref 3.5–5.2)
SODIUM: 144 mmol/L (ref 134–144)
Total Protein: 6.2 g/dL (ref 6.0–8.5)

## 2016-08-21 LAB — LIPID PANEL
Chol/HDL Ratio: 2.8 ratio units (ref 0.0–4.4)
Cholesterol, Total: 146 mg/dL (ref 100–199)
HDL: 53 mg/dL (ref >39)
LDL CALC: 61 mg/dL (ref 0–99)
Triglycerides: 158 mg/dL — ABNORMAL HIGH (ref 0–149)
VLDL CHOLESTEROL CAL: 32 mg/dL (ref 5–40)

## 2016-08-21 LAB — HIV ANTIBODY (ROUTINE TESTING W REFLEX): HIV SCREEN 4TH GENERATION: NONREACTIVE

## 2016-08-21 LAB — TSH: TSH: 2.28 u[IU]/mL (ref 0.450–4.500)

## 2016-11-15 ENCOUNTER — Encounter: Payer: Self-pay | Admitting: Family Medicine

## 2016-11-15 ENCOUNTER — Ambulatory Visit (INDEPENDENT_AMBULATORY_CARE_PROVIDER_SITE_OTHER): Payer: BLUE CROSS/BLUE SHIELD | Admitting: Family Medicine

## 2016-11-15 VITALS — BP 102/69 | HR 80 | Temp 98.3°F | Wt 178.7 lb

## 2016-11-15 DIAGNOSIS — E1129 Type 2 diabetes mellitus with other diabetic kidney complication: Secondary | ICD-10-CM | POA: Diagnosis not present

## 2016-11-15 DIAGNOSIS — E1165 Type 2 diabetes mellitus with hyperglycemia: Secondary | ICD-10-CM

## 2016-11-15 DIAGNOSIS — R809 Proteinuria, unspecified: Secondary | ICD-10-CM

## 2016-11-15 DIAGNOSIS — IMO0002 Reserved for concepts with insufficient information to code with codable children: Secondary | ICD-10-CM

## 2016-11-15 LAB — BAYER DCA HB A1C WAIVED: HB A1C: 7 % — AB (ref <7.0)

## 2016-11-15 MED ORDER — LEVETIRACETAM 1000 MG PO TABS: 1000.0000 mg | ORAL_TABLET | Freq: Two times a day (BID) | ORAL | 1 refills | Status: DC

## 2016-11-15 MED ORDER — CARVEDILOL 3.125 MG PO TABS: 3.1250 mg | ORAL_TABLET | Freq: Two times a day (BID) | ORAL | 1 refills | Status: DC

## 2016-11-15 NOTE — Assessment & Plan Note (Signed)
A1c 7.0. Continue current regimen. Continue diet and exercise. Recheck 3 months.

## 2016-11-15 NOTE — Progress Notes (Signed)
BP 102/69 (BP Location: Left Arm, Patient Position: Sitting, Cuff Size: Normal)   Pulse 80   Temp 98.3 F (36.8 C)   Wt 178 lb 11.2 oz (81.1 kg)   SpO2 96%   BMI 32.68 kg/m    Subjective:    Patient ID: Kim Andrews, female    DOB: 02/13/1966, 51 y.o.   MRN: [id number]  HPI: Kim Andrews is a 51 y.o. female  Chief Complaint  Patient presents with  . Diabetes   DIABETES Hypoglycemic episodes:no Polydipsia/polyuria: no Visual disturbance: no Chest pain: no Paresthesias: no Glucose Monitoring: yes  Accucheck frequency: once a week  Fasting glucose: 140-160 Taking Insulin?: no Blood Pressure Monitoring: not checking Retinal Examination: Up to Date Foot Exam: Up to Date Diabetic Education: Completed Pneumovax: Up to Date Influenza: Up to Date Aspirin: yes  Relevant past medical, surgical, family and social history reviewed and updated as indicated. Interim medical history since our last visit reviewed. Allergies and medications reviewed and updated.  Review of Systems  Constitutional: Negative.   Respiratory: Negative.   Cardiovascular: Negative.   Psychiatric/Behavioral: Negative.     Per HPI unless specifically indicated above     Objective:    BP 102/69 (BP Location: Left Arm, Patient Position: Sitting, Cuff Size: Normal)   Pulse 80   Temp 98.3 F (36.8 C)   Wt 178 lb 11.2 oz (81.1 kg)   SpO2 96%   BMI 32.68 kg/m   Wt Readings from Last 3 Encounters:  11/15/16 178 lb 11.2 oz (81.1 kg)  08/20/16 180 lb (81.6 kg)  07/09/16 184 lb (83.5 kg)    Physical Exam  Constitutional: She is oriented to person, place, and time. She appears well-developed and well-nourished. No distress.  HENT:  Head: Normocephalic and atraumatic.  Right Ear: Hearing normal.  Left Ear: Hearing normal.  Nose: Nose normal.  Eyes: Conjunctivae and lids are normal. Right eye exhibits no discharge. Left eye exhibits no discharge. No scleral icterus.  Cardiovascular: Normal  rate, regular rhythm, normal heart sounds and intact distal pulses.  Exam reveals no gallop and no friction rub.   No murmur heard. Pulmonary/Chest: Effort normal and breath sounds normal. No respiratory distress. She has no wheezes. She has no rales. She exhibits no tenderness.  Musculoskeletal: Normal range of motion.  Neurological: She is alert and oriented to person, place, and time.  Skin: Skin is warm, dry and intact. No rash noted. No erythema. No pallor.  Psychiatric: She has a normal mood and affect. Her speech is normal and behavior is normal. Judgment and thought content normal. Cognition and memory are normal.  Nursing note and vitals reviewed.   Results for orders placed or performed in visit on 08/20/16  Microscopic Examination  Result Value Ref Range   WBC, UA None seen 0 - 5 /hpf   RBC, UA None seen 0 - 2 /hpf   Epithelial Cells (non renal) 0-10 0 - 10 /hpf   Bacteria, UA None seen None seen/Few  CBC with Differential/Platelet  Result Value Ref Range   WBC 8.3 3.4 - 10.8 x10E3/uL   RBC 4.17 3.77 - 5.28 x10E6/uL   Hemoglobin 12.3 11.1 - 15.9 g/dL   Hematocrit 40.938.9 81.134.0 - 46.6 %   MCV 93 79 - 97 fL   MCH 29.5 26.6 - 33.0 pg   MCHC 31.6 31.5 - 35.7 g/dL   RDW 91.413.8 78.212.3 - 95.615.4 %   Platelets 326 150 - 379 x10E3/uL  Neutrophils 59 Not Estab. %   Lymphs 33 Not Estab. %   Monocytes 5 Not Estab. %   Eos 3 Not Estab. %   Basos 0 Not Estab. %   Neutrophils Absolute 4.8 1.4 - 7.0 x10E3/uL   Lymphocytes Absolute 2.7 0.7 - 3.1 x10E3/uL   Monocytes Absolute 0.4 0.1 - 0.9 x10E3/uL   EOS (ABSOLUTE) 0.3 0.0 - 0.4 x10E3/uL   Basophils Absolute 0.0 0.0 - 0.2 x10E3/uL   Immature Granulocytes 0 Not Estab. %   Immature Grans (Abs) 0.0 0.0 - 0.1 x10E3/uL  Comprehensive metabolic panel  Result Value Ref Range   Glucose 185 (H) 65 - 99 mg/dL   BUN 9 6 - 24 mg/dL   Creatinine, Ser 9.60 0.57 - 1.00 mg/dL   GFR calc non Af Amer 108 >59 mL/min/1.73   GFR calc Af Amer 125 >59  mL/min/1.73   BUN/Creatinine Ratio 16 9 - 23   Sodium 144 134 - 144 mmol/L   Potassium 4.2 3.5 - 5.2 mmol/L   Chloride 102 96 - 106 mmol/L   CO2 28 18 - 29 mmol/L   Calcium 8.9 8.7 - 10.2 mg/dL   Total Protein 6.2 6.0 - 8.5 g/dL   Albumin 4.2 3.5 - 5.5 g/dL   Globulin, Total 2.0 1.5 - 4.5 g/dL   Albumin/Globulin Ratio 2.1 1.2 - 2.2   Bilirubin Total <0.2 0.0 - 1.2 mg/dL   Alkaline Phosphatase 86 39 - 117 IU/L   AST 20 0 - 40 IU/L   ALT 28 0 - 32 IU/L  Lipid panel  Result Value Ref Range   Cholesterol, Total 146 100 - 199 mg/dL   Triglycerides 454 (H) 0 - 149 mg/dL   HDL 53 >09 mg/dL   VLDL Cholesterol Cal 32 5 - 40 mg/dL   LDL Calculated 61 0 - 99 mg/dL   Chol/HDL Ratio 2.8 0.0 - 4.4 ratio units  TSH  Result Value Ref Range   TSH 2.280 0.450 - 4.500 uIU/mL  UA/M w/rflx Culture, Routine  Result Value Ref Range   Specific Gravity, UA <1.005 (L) 1.005 - 1.030   pH, UA 5.5 5.0 - 7.5   Color, UA Yellow Yellow   Appearance Ur Clear Clear   Leukocytes, UA Negative Negative   Protein, UA Negative Negative/Trace   Glucose, UA Trace (A) Negative   Ketones, UA Negative Negative   RBC, UA Negative Negative   Bilirubin, UA Negative Negative   Urobilinogen, Ur 0.2 0.2 - 1.0 mg/dL   Nitrite, UA Negative Negative   Microscopic Examination See below:   HIV antibody (with reflex)  Result Value Ref Range   HIV Screen 4th Generation wRfx Non Reactive Non Reactive  Microalbumin, Urine Waived  Result Value Ref Range   Microalb, Ur Waived 10 0 - 19 mg/L   Creatinine, Urine Waived 50 10 - 300 mg/dL   Microalb/Creat Ratio 30-300 (H) <30 mg/g  Bayer DCA Hb A1c Waived  Result Value Ref Range   Bayer DCA Hb A1c Waived 7.4 (H) <7.0 %  Hemoglobin A1c  Result Value Ref Range   Hemoglobin A1C 7.4       Assessment & Plan:   Problem List Items Addressed This Visit      Endocrine   Controlled type 2 diabetes mellitus with microalbuminuria (HCC) - Primary    A1c 7.0. Continue current  regimen. Continue diet and exercise. Recheck 3 months.           Follow up plan: Return  in about 3 months (around 02/15/2017) for DM/Cholesterol/BP follow up.

## 2017-01-01 ENCOUNTER — Telehealth: Payer: Self-pay | Admitting: Family Medicine

## 2017-01-01 MED ORDER — LOSARTAN POTASSIUM 100 MG PO TABS: 100.0000 mg | ORAL_TABLET | Freq: Every day | ORAL | 1 refills | Status: DC

## 2017-01-01 NOTE — Telephone Encounter (Signed)
I've sent through losartan for her. We can recheck her BP at her appointment in September, but if she notes that she's feeling funny, she should be seen sooner. Thanks!

## 2017-01-01 NOTE — Telephone Encounter (Signed)
Spoke with patient, notified that medication was sent over.

## 2017-01-01 NOTE — Telephone Encounter (Signed)
Patient would like for Tiffany to give her a call back.    Thank you  [phone]

## 2017-01-01 NOTE — Telephone Encounter (Signed)
Routing to provider  

## 2017-02-19 ENCOUNTER — Ambulatory Visit: Payer: BLUE CROSS/BLUE SHIELD | Admitting: Family Medicine

## 2017-02-20 ENCOUNTER — Ambulatory Visit (INDEPENDENT_AMBULATORY_CARE_PROVIDER_SITE_OTHER): Payer: BLUE CROSS/BLUE SHIELD | Admitting: Family Medicine

## 2017-02-20 ENCOUNTER — Encounter: Payer: Self-pay | Admitting: Family Medicine

## 2017-02-20 VITALS — BP 123/79 | HR 94 | Temp 98.0°F | Wt 183.2 lb

## 2017-02-20 DIAGNOSIS — I10 Essential (primary) hypertension: Secondary | ICD-10-CM

## 2017-02-20 DIAGNOSIS — E1129 Type 2 diabetes mellitus with other diabetic kidney complication: Secondary | ICD-10-CM | POA: Diagnosis not present

## 2017-02-20 DIAGNOSIS — R809 Proteinuria, unspecified: Secondary | ICD-10-CM

## 2017-02-20 DIAGNOSIS — E785 Hyperlipidemia, unspecified: Secondary | ICD-10-CM | POA: Diagnosis not present

## 2017-02-20 LAB — BAYER DCA HB A1C WAIVED: HB A1C: 7.4 % — AB (ref <7.0)

## 2017-02-20 MED ORDER — SIMVASTATIN 20 MG PO TABS: 20.0000 mg | ORAL_TABLET | Freq: Every day | ORAL | 1 refills | Status: DC

## 2017-02-20 MED ORDER — CARVEDILOL 3.125 MG PO TABS: 3.1250 mg | ORAL_TABLET | Freq: Two times a day (BID) | ORAL | 1 refills | Status: DC

## 2017-02-20 MED ORDER — DULAGLUTIDE 1.5 MG/0.5ML ~~LOC~~ SOAJ: 1.5000 mg | SUBCUTANEOUS | 6 refills | Status: DC

## 2017-02-20 MED ORDER — MELOXICAM 15 MG PO TABS: 15.0000 mg | ORAL_TABLET | Freq: Every day | ORAL | 1 refills | Status: DC

## 2017-02-20 MED ORDER — SAXAGLIPTIN-METFORMIN ER 2.5-1000 MG PO TB24: 1.0000 | ORAL_TABLET | Freq: Two times a day (BID) | ORAL | 1 refills | Status: DC

## 2017-02-20 MED ORDER — GLIPIZIDE ER 5 MG PO TB24: 5.0000 mg | ORAL_TABLET | Freq: Every day | ORAL | 1 refills | Status: DC

## 2017-02-20 NOTE — Assessment & Plan Note (Signed)
Under good control today. Continue current regimen. Continue to monitor. Call with any concerns.  

## 2017-02-20 NOTE — Progress Notes (Signed)
BP 123/79 (BP Location: Left Arm, Patient Position: Sitting, Cuff Size: Normal)   Pulse 94   Temp 98 F (36.7 C)   Wt 183 lb 4 oz (83.1 kg)   SpO2 97%   BMI 33.52 kg/m    Subjective:    Patient ID: Kim Andrews, female    DOB: 18-Aug-1965, 51 y.o.   MRN: [id number]  HPI: Kim Andrews is a 51 y.o. female  Chief Complaint  Patient presents with  . Diabetes  . Hypertension  . Hyperlipidemia   DIABETES Hypoglycemic episodes:no Polydipsia/polyuria: yes Visual disturbance: no Chest pain: no Paresthesias: no Glucose Monitoring: yes  Accucheck frequency: 1x a week  Fasting glucose: 160s-170 Taking Insulin?: no Blood Pressure Monitoring: not checking Retinal Examination: Up to Date Foot Exam: Up to Date Diabetic Education: Completed Pneumovax: Up to Date Influenza: Refused Aspirin: yes  HYPERTENSION / HYPERLIPIDEMIA Satisfied with current treatment? yes Duration of hypertension: chronic BP monitoring frequency: not checking BP medication side effects: no Past BP meds: losartan, carvedilol Duration of hyperlipidemia: chronic Cholesterol medication side effects: no Cholesterol supplements: none Past cholesterol medications: simvastatin Medication compliance: good compliance Aspirin: yes Recent stressors: no Recurrent headaches: no Visual changes: no Palpitations: no Dyspnea: no Chest pain: no Lower extremity edema: no Dizzy/lightheaded: no   Relevant past medical, surgical, family and social history reviewed and updated as indicated. Interim medical history since our last visit reviewed. Allergies and medications reviewed and updated.  Review of Systems  Constitutional: Negative.   Respiratory: Negative.   Cardiovascular: Negative.   Psychiatric/Behavioral: Negative.     Per HPI unless specifically indicated above     Objective:    BP 123/79 (BP Location: Left Arm, Patient Position: Sitting, Cuff Size: Normal)   Pulse 94   Temp 98 F (36.7 C)    Wt 183 lb 4 oz (83.1 kg)   SpO2 97%   BMI 33.52 kg/m   Wt Readings from Last 3 Encounters:  02/20/17 183 lb 4 oz (83.1 kg)  11/15/16 178 lb 11.2 oz (81.1 kg)  08/20/16 180 lb (81.6 kg)    Physical Exam  Constitutional: She is oriented to person, place, and time. She appears well-developed and well-nourished. No distress.  HENT:  Head: Normocephalic and atraumatic.  Right Ear: Hearing normal.  Left Ear: Hearing normal.  Nose: Nose normal.  Eyes: Conjunctivae and lids are normal. Right eye exhibits no discharge. Left eye exhibits no discharge. No scleral icterus.  Cardiovascular: Normal rate, regular rhythm, normal heart sounds and intact distal pulses.  Exam reveals no gallop and no friction rub.   No murmur heard. Pulmonary/Chest: Effort normal and breath sounds normal. No respiratory distress. She has no wheezes. She has no rales. She exhibits no tenderness.  Musculoskeletal: Normal range of motion.  Neurological: She is alert and oriented to person, place, and time.  Skin: Skin is warm, dry and intact. No rash noted. She is not diaphoretic. No erythema. No pallor.  Psychiatric: She has a normal mood and affect. Her speech is normal and behavior is normal. Judgment and thought content normal. Cognition and memory are normal.  Nursing note and vitals reviewed.   Results for orders placed or performed in visit on 11/15/16  Bayer DCA Hb A1c Waived  Result Value Ref Range   Bayer DCA Hb A1c Waived 7.0 (H) <7.0 %      Assessment & Plan:   Problem List Items Addressed This Visit      Cardiovascular and Mediastinum  Benign hypertension    Under good control today. Continue current regimen. Continue to monitor. Call with any concerns.       Relevant Medications   simvastatin (ZOCOR) 20 MG tablet   carvedilol (COREG) 3.125 MG tablet   Other Relevant Orders   Comprehensive metabolic panel     Endocrine   Controlled type 2 diabetes mellitus with microalbuminuria (HCC)     Not under good control with A1c of 7.4- will add glipizide and recheck 3 months. Call with any concerns.       Relevant Medications   glipiZIDE (GLUCOTROL XL) 5 MG 24 hr tablet   simvastatin (ZOCOR) 20 MG tablet   Saxagliptin-Metformin 2.10-998 MG TB24   Dulaglutide (TRULICITY) 1.5 MG/0.5ML SOPN   Other Relevant Orders   Comprehensive metabolic panel     Other   Hyperlipemia - Primary    Under good control today. Continue current regimen. Continue to monitor. Call with any concerns.       Relevant Medications   simvastatin (ZOCOR) 20 MG tablet   carvedilol (COREG) 3.125 MG tablet   Other Relevant Orders   Bayer DCA Hb A1c Waived   Comprehensive metabolic panel   Lipid Panel w/o Chol/HDL Ratio       Follow up plan: Return in about 3 months (around 05/22/2017) for DM follow up.

## 2017-02-20 NOTE — Assessment & Plan Note (Signed)
Not under good control with A1c of 7.4- will add glipizide and recheck 3 months. Call with any concerns.

## 2017-02-21 LAB — LIPID PANEL W/O CHOL/HDL RATIO
CHOLESTEROL TOTAL: 127 mg/dL (ref 100–199)
HDL: 48 mg/dL (ref >39)
LDL Calculated: 57 mg/dL (ref 0–99)
TRIGLYCERIDES: 110 mg/dL (ref 0–149)
VLDL CHOLESTEROL CAL: 22 mg/dL (ref 5–40)

## 2017-02-21 LAB — COMPREHENSIVE METABOLIC PANEL
ALBUMIN: 4.2 g/dL (ref 3.5–5.5)
ALK PHOS: 72 IU/L (ref 39–117)
ALT: 30 IU/L (ref 0–32)
AST: 22 IU/L (ref 0–40)
Albumin/Globulin Ratio: 2.1 (ref 1.2–2.2)
BUN / CREAT RATIO: 20 (ref 9–23)
BUN: 9 mg/dL (ref 6–24)
Bilirubin Total: <0.2 mg/dL (ref 0.0–1.2)
CHLORIDE: 104 mmol/L (ref 96–106)
CO2: 23 mmol/L (ref 20–29)
CREATININE: 0.46 mg/dL — AB (ref 0.57–1.00)
Calcium: 9.1 mg/dL (ref 8.7–10.2)
GFR calc Af Amer: 134 mL/min/{1.73_m2} (ref >59)
GFR calc non Af Amer: 116 mL/min/{1.73_m2} (ref >59)
GLUCOSE: 157 mg/dL — AB (ref 65–99)
Globulin, Total: 2 g/dL (ref 1.5–4.5)
Potassium: 4.3 mmol/L (ref 3.5–5.2)
Sodium: 147 mmol/L — ABNORMAL HIGH (ref 134–144)
Total Protein: 6.2 g/dL (ref 6.0–8.5)

## 2017-02-22 ENCOUNTER — Encounter: Payer: Self-pay | Admitting: Family Medicine

## 2017-03-07 ENCOUNTER — Telehealth: Payer: Self-pay | Admitting: Family Medicine

## 2017-03-07 NOTE — Telephone Encounter (Signed)
We didn't increase the trulicity- we had that she had been taking the 1.5 for some time. If she has not, she can take it, but she doesn't need to take the glipizide that we added last visit.

## 2017-03-07 NOTE — Telephone Encounter (Signed)
Patient received medication and saw that her trulicity was increased. Patient wanted to make sure the increased dosage on medication was increased by provider and that it is safe to take.  Patient stated if she receives a call after 10 am that staff can leave a detailed message due to patient being at work.  Please Advise.  Thank  You

## 2017-03-07 NOTE — Telephone Encounter (Signed)
Routing to provider  

## 2017-03-07 NOTE — Telephone Encounter (Signed)
Patient notified

## 2017-05-22 ENCOUNTER — Telehealth: Payer: Self-pay | Admitting: Family Medicine

## 2017-05-22 ENCOUNTER — Ambulatory Visit: Payer: BLUE CROSS/BLUE SHIELD | Admitting: Family Medicine

## 2017-05-22 ENCOUNTER — Encounter: Payer: Self-pay | Admitting: Family Medicine

## 2017-05-22 VITALS — BP 107/70 | HR 88 | Temp 97.6°F | Wt 184.4 lb

## 2017-05-22 DIAGNOSIS — R809 Proteinuria, unspecified: Secondary | ICD-10-CM | POA: Diagnosis not present

## 2017-05-22 DIAGNOSIS — E1129 Type 2 diabetes mellitus with other diabetic kidney complication: Secondary | ICD-10-CM

## 2017-05-22 LAB — BAYER DCA HB A1C WAIVED: HB A1C: 6.4 % (ref <7.0)

## 2017-05-22 MED ORDER — LEVETIRACETAM 1000 MG PO TABS: 1000.0000 mg | ORAL_TABLET | Freq: Two times a day (BID) | ORAL | 1 refills | Status: DC

## 2017-05-22 MED ORDER — SAXAGLIPTIN-METFORMIN ER 2.5-1000 MG PO TB24: 1.0000 | ORAL_TABLET | Freq: Two times a day (BID) | ORAL | 1 refills | Status: DC

## 2017-05-22 MED ORDER — GLIPIZIDE ER 5 MG PO TB24: 5.0000 mg | ORAL_TABLET | Freq: Every day | ORAL | 1 refills | Status: DC

## 2017-05-22 MED ORDER — DULAGLUTIDE 1.5 MG/0.5ML ~~LOC~~ SOAJ: 1.5000 mg | SUBCUTANEOUS | 6 refills | Status: DC

## 2017-05-22 MED ORDER — CARVEDILOL 3.125 MG PO TABS: 3.1250 mg | ORAL_TABLET | Freq: Two times a day (BID) | ORAL | 1 refills | Status: DC

## 2017-05-22 MED ORDER — LOSARTAN POTASSIUM 100 MG PO TABS: 100.0000 mg | ORAL_TABLET | Freq: Every day | ORAL | 1 refills | Status: DC

## 2017-05-22 MED ORDER — SIMVASTATIN 20 MG PO TABS: 20.0000 mg | ORAL_TABLET | Freq: Every day | ORAL | 1 refills | Status: DC

## 2017-05-22 NOTE — Assessment & Plan Note (Addendum)
Under good control with A1c of 6.4. Continue current regimen. Continue to monitor. Call with any concerns. Recheck 6 months.

## 2017-05-22 NOTE — Progress Notes (Signed)
BP 107/70 (BP Location: Left Arm, Patient Position: Sitting, Cuff Size: Normal)   Pulse 88   Temp 97.6 F (36.4 C)   Wt 184 lb 7 oz (83.7 kg)   SpO2 96%   BMI 33.73 kg/m    Subjective:    Patient ID: Kim Andrews, female    DOB: 1965-12-31, 51 y.o.   MRN: [id number]  HPI: Kim Andrews is a 51 y.o. female  Chief Complaint  Patient presents with  . Diabetes   DIABETES Hypoglycemic episodes:no Polydipsia/polyuria: no Visual disturbance: no Chest pain: no Paresthesias: no Glucose Monitoring: no  Accucheck frequency: BID  Fasting glucose: 119-130s Taking Insulin?: no Blood Pressure Monitoring: not checking Retinal Examination: Not up to Date Foot Exam: Up to Date Diabetic Education: Completed Pneumovax: Refused Influenza: Up to Date Aspirin: no  Relevant past medical, surgical, family and social history reviewed and updated as indicated. Interim medical history since our last visit reviewed. Allergies and medications reviewed and updated.  Review of Systems  Constitutional: Negative.   Respiratory: Negative.   Cardiovascular: Negative.   Psychiatric/Behavioral: Negative.     Per HPI unless specifically indicated above     Objective:    BP 107/70 (BP Location: Left Arm, Patient Position: Sitting, Cuff Size: Normal)   Pulse 88   Temp 97.6 F (36.4 C)   Wt 184 lb 7 oz (83.7 kg)   SpO2 96%   BMI 33.73 kg/m   Wt Readings from Last 3 Encounters:  05/22/17 184 lb 7 oz (83.7 kg)  02/20/17 183 lb 4 oz (83.1 kg)  11/15/16 178 lb 11.2 oz (81.1 kg)    Physical Exam  Constitutional: She is oriented to person, place, and time. She appears well-developed and well-nourished. No distress.  HENT:  Head: Normocephalic and atraumatic.  Right Ear: Hearing normal.  Left Ear: Hearing normal.  Nose: Nose normal.  Eyes: Conjunctivae and lids are normal. Right eye exhibits no discharge. Left eye exhibits no discharge. No scleral icterus.  Cardiovascular: Normal rate,  regular rhythm, normal heart sounds and intact distal pulses. Exam reveals no gallop and no friction rub.  No murmur heard. Pulmonary/Chest: Effort normal and breath sounds normal. No respiratory distress. She has no wheezes. She has no rales. She exhibits no tenderness.  Musculoskeletal: Normal range of motion.  Neurological: She is alert and oriented to person, place, and time.  Skin: Skin is warm, dry and intact. No rash noted. She is not diaphoretic. No erythema. No pallor.  Psychiatric: She has a normal mood and affect. Her speech is normal and behavior is normal. Judgment and thought content normal. Cognition and memory are normal.  Nursing note and vitals reviewed.   Results for orders placed or performed in visit on 02/20/17  Bayer DCA Hb A1c Waived  Result Value Ref Range   Bayer DCA Hb A1c Waived 7.4 (H) <7.0 %  Comprehensive metabolic panel  Result Value Ref Range   Glucose 157 (H) 65 - 99 mg/dL   BUN 9 6 - 24 mg/dL   Creatinine, Ser 4.090.46 (L) 0.57 - 1.00 mg/dL   GFR calc non Af Amer 116 >59 mL/min/1.73   GFR calc Af Amer 134 >59 mL/min/1.73   BUN/Creatinine Ratio 20 9 - 23   Sodium 147 (H) 134 - 144 mmol/L   Potassium 4.3 3.5 - 5.2 mmol/L   Chloride 104 96 - 106 mmol/L   CO2 23 20 - 29 mmol/L   Calcium 9.1 8.7 - 10.2 mg/dL  Total Protein 6.2 6.0 - 8.5 g/dL   Albumin 4.2 3.5 - 5.5 g/dL   Globulin, Total 2.0 1.5 - 4.5 g/dL   Albumin/Globulin Ratio 2.1 1.2 - 2.2   Bilirubin Total <0.2 0.0 - 1.2 mg/dL   Alkaline Phosphatase 72 39 - 117 IU/L   AST 22 0 - 40 IU/L   ALT 30 0 - 32 IU/L  Lipid Panel w/o Chol/HDL Ratio  Result Value Ref Range   Cholesterol, Total 127 100 - 199 mg/dL   Triglycerides 161110 0 - 149 mg/dL   HDL 48 >09>39 mg/dL   VLDL Cholesterol Cal 22 5 - 40 mg/dL   LDL Calculated 57 0 - 99 mg/dL      Assessment & Plan:   Problem List Items Addressed This Visit      Endocrine   Controlled type 2 diabetes mellitus with microalbuminuria (HCC) - Primary     Under good control with A1c of 6.4. Continue current regimen. Continue to monitor. Call with any concerns. Recheck 6 months.       Relevant Medications   losartan (COZAAR) 100 MG tablet   Saxagliptin-Metformin 2.10-998 MG TB24   glipiZIDE (GLUCOTROL XL) 5 MG 24 hr tablet   Dulaglutide (TRULICITY) 1.5 MG/0.5ML SOPN   simvastatin (ZOCOR) 20 MG tablet   Other Relevant Orders   Bayer DCA Hb A1c Waived       Follow up plan: Return in about 6 months (around 11/20/2017) for Physical.

## 2017-05-22 NOTE — Telephone Encounter (Signed)
Called patient with a1c of 6.4- OK to recheck 6 months

## 2017-09-03 LAB — HM DIABETES EYE EXAM

## 2017-09-12 ENCOUNTER — Other Ambulatory Visit: Payer: Self-pay | Admitting: Family Medicine

## 2017-11-22 ENCOUNTER — Ambulatory Visit (INDEPENDENT_AMBULATORY_CARE_PROVIDER_SITE_OTHER): Payer: BLUE CROSS/BLUE SHIELD | Admitting: Family Medicine

## 2017-11-22 ENCOUNTER — Encounter: Payer: Self-pay | Admitting: Family Medicine

## 2017-11-22 VITALS — BP 101/68 | HR 80 | Temp 98.6°F | Ht 62.7 in | Wt 185.4 lb

## 2017-11-22 DIAGNOSIS — I10 Essential (primary) hypertension: Secondary | ICD-10-CM | POA: Diagnosis not present

## 2017-11-22 DIAGNOSIS — N390 Urinary tract infection, site not specified: Secondary | ICD-10-CM | POA: Diagnosis not present

## 2017-11-22 DIAGNOSIS — R809 Proteinuria, unspecified: Secondary | ICD-10-CM

## 2017-11-22 DIAGNOSIS — R8281 Pyuria: Secondary | ICD-10-CM

## 2017-11-22 DIAGNOSIS — E785 Hyperlipidemia, unspecified: Secondary | ICD-10-CM | POA: Diagnosis not present

## 2017-11-22 DIAGNOSIS — Z0001 Encounter for general adult medical examination with abnormal findings: Secondary | ICD-10-CM

## 2017-11-22 DIAGNOSIS — E1129 Type 2 diabetes mellitus with other diabetic kidney complication: Secondary | ICD-10-CM

## 2017-11-22 DIAGNOSIS — Z0000 Encounter for general adult medical examination without abnormal findings: Secondary | ICD-10-CM

## 2017-11-22 LAB — URINE CULTURE, REFLEX

## 2017-11-22 MED ORDER — GLIPIZIDE ER 5 MG PO TB24: 5.0000 mg | ORAL_TABLET | Freq: Every day | ORAL | 1 refills | Status: DC

## 2017-11-22 MED ORDER — DULAGLUTIDE 1.5 MG/0.5ML ~~LOC~~ SOAJ: 1.5000 mg | SUBCUTANEOUS | 6 refills | Status: DC

## 2017-11-22 MED ORDER — LEVETIRACETAM 1000 MG PO TABS: 1000.0000 mg | ORAL_TABLET | Freq: Two times a day (BID) | ORAL | 1 refills | Status: AC

## 2017-11-22 MED ORDER — LOSARTAN POTASSIUM 100 MG PO TABS: 100.0000 mg | ORAL_TABLET | Freq: Every day | ORAL | 1 refills | Status: AC

## 2017-11-22 MED ORDER — SIMVASTATIN 20 MG PO TABS: 20.0000 mg | ORAL_TABLET | Freq: Every day | ORAL | 1 refills | Status: AC

## 2017-11-22 MED ORDER — CARVEDILOL 3.125 MG PO TABS: 3.1250 mg | ORAL_TABLET | Freq: Two times a day (BID) | ORAL | 1 refills | Status: AC

## 2017-11-22 MED ORDER — MELOXICAM 15 MG PO TABS: ORAL_TABLET | ORAL | 1 refills | Status: DC

## 2017-11-22 MED ORDER — SAXAGLIPTIN-METFORMIN ER 2.5-1000 MG PO TB24: 1.0000 | ORAL_TABLET | Freq: Two times a day (BID) | ORAL | 1 refills | Status: DC

## 2017-11-22 NOTE — Assessment & Plan Note (Signed)
Under good control with A1c of 6.0. Continue current regimen. Continue to monitor. Refills given. Call with any concerns.

## 2017-11-22 NOTE — Assessment & Plan Note (Signed)
Under good control. Continue current regimen. Continue to monitor. Refills given. Call with any concerns.  

## 2017-11-22 NOTE — Progress Notes (Signed)
BP 101/68   Pulse 80   Temp 98.6 F (37 C) (Oral)   Ht 5' 2.7" (1.593 m)   Wt 185 lb 6.4 oz (84.1 kg)   SpO2 95%   BMI 33.16 kg/m    Subjective:    Patient ID: Kim Andrews, female    DOB: 22-Jan-1966, 52 y.o.   MRN: [id number]  HPI: Kim Andrews is a 52 y.o. female presenting on 11/22/2017 for comprehensive medical examination. Current medical complaints include:  HYPERTENSION / HYPERLIPIDEMIA Satisfied with current treatment? yes Duration of hypertension: chronic BP monitoring frequency: not checking BP medication side effects: no Past BP meds: losartan, carvedilol Duration of hyperlipidemia: chronic Cholesterol medication side effects: no Cholesterol supplements: none Past cholesterol medications: simvastatin Medication compliance: excellent compliance Aspirin: yes Recent stressors: no Recurrent headaches: no Visual changes: no Palpitations: no Dyspnea: no Chest pain: no Lower extremity edema: no Dizzy/lightheaded: no  DIABETES Hypoglycemic episodes:yes- with the heat Polydipsia/polyuria: no Visual disturbance: no Chest pain: no Paresthesias: no Glucose Monitoring: no Taking Insulin?: no Blood Pressure Monitoring: not checking Retinal Examination: Up to Date Foot Exam: Done today Diabetic Education: Completed Pneumovax: Up to Date Influenza: Up to Date Aspirin: yes  She currently lives with: husband Menopausal Symptoms: no  Depression Screen done today and results listed below:  Depression screen [facility] 2/9 11/22/2017 08/20/2016  Decreased Interest 0 0  Down, Depressed, Hopeless 0 0  PHQ - 2 Score 0 0    Past Medical History:  Past Medical History:  Diagnosis Date  . Chronic kidney disease   . Diabetes mellitus without complication (HCC)   . Hyperlipidemia   . Seizures [facility])     Surgical History:  Past Surgical History:  Procedure Laterality Date  . ABDOMINAL HYSTERECTOMY     ovaries remain, due to heavy bleeding  . KNEE SURGERY       Medications:  Current Outpatient Medications on File Prior to Visit  Medication Sig  . aspirin EC 81 MG tablet Take 81 mg by mouth daily.  . Calcium Carbonate-Vitamin D 600-400 MG-UNIT per tablet Take 1 tablet by mouth 2 (two) times daily.   . meclizine (ANTIVERT) 25 MG tablet Take 1 tablet (25 mg total) by mouth 3 (three) times daily as needed for dizziness. (Patient taking differently: Take 25 mg by mouth 3 (three) times daily as needed for dizziness. PRN)  . ranitidine (ZANTAC) 300 MG tablet Take 300 mg by mouth 2 (two) times daily.  . traMADol (ULTRAM) 50 MG tablet Take 1 tablet (50 mg total) by mouth every 8 (eight) hours as needed.   No current facility-administered medications on file prior to visit.     Allergies:  Allergies  Allergen Reactions  . Tegretol [Carbamazepine] Other (See Comments)    Worsening seizures  . Dilantin [Phenytoin Sodium Extended]     Social History:  Social History   Socioeconomic History  . Marital status: Single    Spouse name: Not on file  . Number of children: Not on file  . Years of education: Not on file  . Highest education level: Not on file  Occupational History  . Not on file  Social Needs  . Financial resource strain: Not on file  . Food insecurity:    Worry: Not on file    Inability: Not on file  . Transportation needs:    Medical: Not on file    Non-medical: Not on file  Tobacco Use  . Smoking status: Never Smoker  .  Smokeless tobacco: Never Used  Substance and Sexual Activity  . Alcohol use: No  . Drug use: No  . Sexual activity: Yes    Birth control/protection: Surgical  Lifestyle  . Physical activity:    Days per week: Not on file    Minutes per session: Not on file  . Stress: Not on file  Relationships  . Social connections:    Talks on phone: Not on file    Gets together: Not on file    Attends religious service: Not on file    Active member of club or organization: Not on file    Attends meetings of  clubs or organizations: Not on file    Relationship status: Not on file  . Intimate partner violence:    Fear of current or ex partner: Not on file    Emotionally abused: Not on file    Physically abused: Not on file    Forced sexual activity: Not on file  Other Topics Concern  . Not on file  Social History Narrative  . Not on file   Social History   Tobacco Use  Smoking Status Never Smoker  Smokeless Tobacco Never Used   Social History   Substance and Sexual Activity  Alcohol Use No    Family History:  Family History  Problem Relation Age of Onset  . Diabetes Mother   . Heart disease Mother   . Diabetes Father   . Heart disease Father   . Seizures Brother   . Hypertension Paternal Grandfather     Past medical history, surgical history, medications, allergies, family history and social history reviewed with patient today and changes made to appropriate areas of the chart.   Review of Systems  Constitutional: Negative.   HENT: Negative.   Eyes: Negative.   Respiratory: Negative.   Cardiovascular: Negative.   Gastrointestinal: Negative.   Genitourinary: Negative.   Musculoskeletal: Positive for joint pain. Negative for back pain, falls, myalgias and neck pain.  Skin: Negative.   Neurological: Negative.   Endo/Heme/Allergies: Positive for environmental allergies. Negative for polydipsia. Bruises/bleeds easily.  Psychiatric/Behavioral: Negative.     All other ROS negative except what is listed above and in the HPI.      Objective:    BP 101/68   Pulse 80   Temp 98.6 F (37 C) (Oral)   Ht 5' 2.7" (1.593 m)   Wt 185 lb 6.4 oz (84.1 kg)   SpO2 95%   BMI 33.16 kg/m   Wt Readings from Last 3 Encounters:  11/22/17 185 lb 6.4 oz (84.1 kg)  05/22/17 184 lb 7 oz (83.7 kg)  02/20/17 183 lb 4 oz (83.1 kg)    Physical Exam  Constitutional: She is oriented to person, place, and time. She appears well-developed and well-nourished. No distress.  HENT:  Head:  Normocephalic and atraumatic.  Right Ear: Hearing, tympanic membrane, external ear and ear canal normal.  Left Ear: Hearing, tympanic membrane, external ear and ear canal normal.  Nose: Nose normal.  Mouth/Throat: Uvula is midline, oropharynx is clear and moist and mucous membranes are normal. No oropharyngeal exudate.  Eyes: Pupils are equal, round, and reactive to light. Conjunctivae, EOM and lids are normal. Right eye exhibits no discharge. Left eye exhibits no discharge. No scleral icterus.  Neck: Normal range of motion. Neck supple. No JVD present. No tracheal deviation present. No thyromegaly present.  Cardiovascular: Normal rate, regular rhythm, normal heart sounds and intact distal pulses. Exam reveals no gallop and  no friction rub.  No murmur heard. Pulmonary/Chest: Effort normal and breath sounds normal. No stridor. No respiratory distress. She has no wheezes. She has no rales. She exhibits no tenderness. Right breast exhibits no inverted nipple, no mass, no nipple discharge, no skin change and no tenderness. Left breast exhibits no inverted nipple, no mass, no nipple discharge, no skin change and no tenderness. No breast swelling, tenderness, discharge or bleeding. Breasts are symmetrical.  Abdominal: Soft. Bowel sounds are normal. She exhibits no distension and no mass. There is no tenderness. There is no rebound and no guarding. No hernia.  Musculoskeletal: Normal range of motion. She exhibits no edema, tenderness or deformity.  Lymphadenopathy:    She has no cervical adenopathy.  Neurological: She is alert and oriented to person, place, and time. She displays normal reflexes. No cranial nerve deficit or sensory deficit. She exhibits normal muscle tone. Coordination normal.  Skin: Skin is warm, dry and intact. Capillary refill takes less than 2 seconds. No rash noted. She is not diaphoretic. No erythema. No pallor.  Psychiatric: She has a normal mood and affect. Her speech is normal and  behavior is normal. Judgment and thought content normal. Cognition and memory are normal.  Nursing note and vitals reviewed.   Results for orders placed or performed in visit on 11/22/17  Microscopic Examination  Result Value Ref Range   WBC, UA 6-10 (A) 0 - 5 /hpf   RBC, UA None seen 0 - 2 /hpf   Epithelial Cells (non renal) 0-10 0 - 10 /hpf   Crystals Present (A) N/A   Crystal Type Amorphous Sediment N/A   Bacteria, UA Moderate (A) None seen/Few  Urine Culture, Reflex  Result Value Ref Range   Urine Culture, Routine WILL FOLLOW   Bayer DCA Hb A1c Waived  Result Value Ref Range   HB A1C (BAYER DCA - WAIVED) 6.0 <7.0 %  Microalbumin, Urine Waived  Result Value Ref Range   Microalb, Ur Waived 10 0 - 19 mg/L   Creatinine, Urine Waived 10 10 - 300 mg/dL   Microalb/Creat Ratio 30-300 (H) <30 mg/g  UA/M w/rflx Culture, Routine  Result Value Ref Range   Specific Gravity, UA <1.005 (L) 1.005 - 1.030   pH, UA 6.5 5.0 - 7.5   Color, UA Yellow Yellow   Appearance Ur Cloudy (A) Clear   Leukocytes, UA 2+ (A) Negative   Protein, UA Negative Negative/Trace   Glucose, UA Negative Negative   Ketones, UA Negative Negative   RBC, UA Negative Negative   Bilirubin, UA Negative Negative   Urobilinogen, Ur 0.2 0.2 - 1.0 mg/dL   Nitrite, UA Negative Negative   Microscopic Examination See below:    Urinalysis Reflex Comment       Assessment & Plan:   Problem List Items Addressed This Visit      Cardiovascular and Mediastinum   Benign hypertension    Under good control. Continue current regimen. Continue to monitor. Refills given. Call with any concerns.       Relevant Medications   simvastatin (ZOCOR) 20 MG tablet   losartan (COZAAR) 100 MG tablet   carvedilol (COREG) 3.125 MG tablet   Other Relevant Orders   Bayer DCA Hb A1c Waived (Completed)   CBC with Differential/Platelet   Comprehensive metabolic panel   Lipid Panel w/o Chol/HDL Ratio   Microalbumin, Urine Waived (Completed)    TSH   UA/M w/rflx Culture, Routine (Completed)     Endocrine   Controlled type  2 diabetes mellitus with microalbuminuria (HCC)    Under good control with A1c of 6.0. Continue current regimen. Continue to monitor. Refills given. Call with any concerns.       Relevant Medications   simvastatin (ZOCOR) 20 MG tablet   Saxagliptin-Metformin 2.10-998 MG TB24   losartan (COZAAR) 100 MG tablet   glipiZIDE (GLUCOTROL XL) 5 MG 24 hr tablet   Dulaglutide (TRULICITY) 1.5 MG/0.5ML SOPN   Other Relevant Orders   Bayer DCA Hb A1c Waived (Completed)   CBC with Differential/Platelet   Comprehensive metabolic panel   Lipid Panel w/o Chol/HDL Ratio   Microalbumin, Urine Waived (Completed)   TSH   UA/M w/rflx Culture, Routine (Completed)     Other   Hyperlipemia    Under good control. Continue current regimen. Continue to monitor. Refills given. Call with any concerns.       Relevant Medications   simvastatin (ZOCOR) 20 MG tablet   losartan (COZAAR) 100 MG tablet   carvedilol (COREG) 3.125 MG tablet   Other Relevant Orders   Bayer DCA Hb A1c Waived (Completed)   CBC with Differential/Platelet   Comprehensive metabolic panel   Lipid Panel w/o Chol/HDL Ratio   Microalbumin, Urine Waived (Completed)   TSH   UA/M w/rflx Culture, Routine (Completed)    Other Visit Diagnoses    Routine general medical examination at a health care facility    -  Primary   Vaccines up to date. Screening labs checked today. Pap N/A. Colonoscopy and mammogram declined. Call with any concerns. Continue to monitor.    Relevant Orders   Bayer DCA Hb A1c Waived (Completed)   CBC with Differential/Platelet   Comprehensive metabolic panel   Lipid Panel w/o Chol/HDL Ratio   Microalbumin, Urine Waived (Completed)   TSH   UA/M w/rflx Culture, Routine (Completed)   Pyuria       Checking culture. Await results.    Relevant Orders   Urine Culture       Follow up plan: Return in about 6 months (around 05/24/2018)  for Follow up.   LABORATORY TESTING:  - Pap smear: not applicable  IMMUNIZATIONS:   - Tdap: Tetanus vaccination status reviewed: last tetanus booster within 10 years. - Influenza: Up to date - Pneumovax: Up to date - Prevnar: Not applicable - HPV: Not applicable - Zostavax vaccine: Not applicable  SCREENING: -Mammogram: Refused  - Colonoscopy: Refused   PATIENT COUNSELING:   Advised to take 1 mg of folate supplement per day if capable of pregnancy.   Sexuality: Discussed sexually transmitted diseases, partner selection, use of condoms, avoidance of unintended pregnancy  and contraceptive alternatives.   Advised to avoid cigarette smoking.  I discussed with the patient that most people either abstain from alcohol or drink within safe limits (<=14/week and <=4 drinks/occasion for males, <=7/weeks and <= 3 drinks/occasion for females) and that the risk for alcohol disorders and other health effects rises proportionally with the number of drinks per week and how often a drinker exceeds daily limits.  Discussed cessation/primary prevention of drug use and availability of treatment for abuse.   Diet: Encouraged to adjust caloric intake to maintain  or achieve ideal body weight, to reduce intake of dietary saturated fat and total fat, to limit sodium intake by avoiding high sodium foods and not adding table salt, and to maintain adequate dietary potassium and calcium preferably from fresh fruits, vegetables, and low-fat dairy products.    stressed the importance of regular exercise  Injury prevention:  Discussed safety belts, safety helmets, smoke detector, smoking near bedding or upholstery.   Dental health: Discussed importance of regular tooth brushing, flossing, and dental visits.    NEXT PREVENTATIVE PHYSICAL DUE IN 1 YEAR. Return in about 6 months (around 05/24/2018) for Follow up.

## 2017-11-23 LAB — CBC WITH DIFFERENTIAL/PLATELET
BASOS ABS: 0 10*3/uL (ref 0.0–0.2)
Basos: 0 %
EOS (ABSOLUTE): 0.4 10*3/uL (ref 0.0–0.4)
EOS: 5 %
HEMATOCRIT: 38.2 % (ref 34.0–46.6)
Hemoglobin: 12.5 g/dL (ref 11.1–15.9)
IMMATURE GRANULOCYTES: 0 %
Immature Grans (Abs): 0 10*3/uL (ref 0.0–0.1)
LYMPHS ABS: 3.2 10*3/uL — AB (ref 0.7–3.1)
Lymphs: 35 %
MCH: 29.9 pg (ref 26.6–33.0)
MCHC: 32.7 g/dL (ref 31.5–35.7)
MCV: 91 fL (ref 79–97)
MONOS ABS: 0.4 10*3/uL (ref 0.1–0.9)
Monocytes: 5 %
Neutrophils Absolute: 5.2 10*3/uL (ref 1.4–7.0)
Neutrophils: 55 %
PLATELETS: 331 10*3/uL (ref 150–450)
RBC: 4.18 x10E6/uL (ref 3.77–5.28)
RDW: 14 % (ref 12.3–15.4)
WBC: 9.2 10*3/uL (ref 3.4–10.8)

## 2017-11-23 LAB — COMPREHENSIVE METABOLIC PANEL
ALK PHOS: 69 IU/L (ref 39–117)
ALT: 21 IU/L (ref 0–32)
AST: 16 IU/L (ref 0–40)
Albumin/Globulin Ratio: 2.4 — ABNORMAL HIGH (ref 1.2–2.2)
Albumin: 4.5 g/dL (ref 3.5–5.5)
BUN/Creatinine Ratio: 14 (ref 9–23)
BUN: 8 mg/dL (ref 6–24)
Bilirubin Total: <0.2 mg/dL (ref 0.0–1.2)
CALCIUM: 9.4 mg/dL (ref 8.7–10.2)
CO2: 25 mmol/L (ref 20–29)
CREATININE: 0.59 mg/dL (ref 0.57–1.00)
Chloride: 102 mmol/L (ref 96–106)
GFR calc Af Amer: 123 mL/min/{1.73_m2} (ref >59)
GFR, EST NON AFRICAN AMERICAN: 106 mL/min/{1.73_m2} (ref >59)
GLOBULIN, TOTAL: 1.9 g/dL (ref 1.5–4.5)
GLUCOSE: 114 mg/dL — AB (ref 65–99)
Potassium: 4.2 mmol/L (ref 3.5–5.2)
SODIUM: 143 mmol/L (ref 134–144)
Total Protein: 6.4 g/dL (ref 6.0–8.5)

## 2017-11-23 LAB — LIPID PANEL W/O CHOL/HDL RATIO
Cholesterol, Total: 128 mg/dL (ref 100–199)
HDL: 44 mg/dL (ref >39)
LDL Calculated: 59 mg/dL (ref 0–99)
Triglycerides: 126 mg/dL (ref 0–149)
VLDL Cholesterol Cal: 25 mg/dL (ref 5–40)

## 2017-11-23 LAB — TSH: TSH: 3.18 u[IU]/mL (ref 0.450–4.500)

## 2017-11-24 LAB — URINE CULTURE

## 2017-11-25 ENCOUNTER — Encounter: Payer: Self-pay | Admitting: Family Medicine

## 2017-11-26 LAB — MICROSCOPIC EXAMINATION: RBC, UA: NONE SEEN /hpf (ref 0–2)

## 2017-11-26 LAB — UA/M W/RFLX CULTURE, ROUTINE
BILIRUBIN UA: NEGATIVE
GLUCOSE, UA: NEGATIVE
KETONES UA: NEGATIVE
NITRITE UA: NEGATIVE
PROTEIN UA: NEGATIVE
RBC UA: NEGATIVE
UUROB: 0.2 mg/dL (ref 0.2–1.0)
pH, UA: 6.5 (ref 5.0–7.5)

## 2017-11-26 LAB — BAYER DCA HB A1C WAIVED: HB A1C (BAYER DCA - WAIVED): 6 % (ref <7.0)

## 2017-11-26 LAB — MICROALBUMIN, URINE WAIVED
Creatinine, Urine Waived: 10 mg/dL (ref 10–300)
Microalb, Ur Waived: 10 mg/L (ref 0–19)

## 2018-01-16 ENCOUNTER — Other Ambulatory Visit: Payer: Self-pay | Admitting: Family Medicine

## 2018-02-21 ENCOUNTER — Ambulatory Visit: Payer: Self-pay | Admitting: Family Medicine

## 2018-06-06 ENCOUNTER — Ambulatory Visit: Payer: BLUE CROSS/BLUE SHIELD | Admitting: Family Medicine

## 2018-06-18 ENCOUNTER — Other Ambulatory Visit: Payer: Self-pay | Admitting: Family Medicine

## 2018-06-19 NOTE — Telephone Encounter (Signed)
Needs follow-up

## 2018-06-19 NOTE — Telephone Encounter (Signed)
Call placed to patient.  Left VM to call office about medication refill request.

## 2018-06-19 NOTE — Telephone Encounter (Signed)
Noted last appt. 06/06/18 was cancelled due to need to change to provider in network.  Left message for pt. To call office and inform if she has already seen a new provider for refill of Trulicity.

## 2018-06-20 ENCOUNTER — Encounter: Payer: Self-pay | Admitting: Family Medicine

## 2018-06-20 NOTE — Telephone Encounter (Signed)
LVM for pt to call back to schedule an appt also printing letter to mail.

## 2018-11-06 ENCOUNTER — Other Ambulatory Visit: Payer: Self-pay | Admitting: Family Medicine

## 2018-11-06 NOTE — Telephone Encounter (Signed)
Requested medication (s) are due for refill today: no  Requested medication (s) are on the active medication list: no   Future visit scheduled: No  Notes to clinic:  Unable to refill per protocol. No recent labs or future visit schdeduled    Requested Prescriptions  Pending Prescriptions Disp Refills   KOMBIGLYZE XR 2.10-998 MG TB24 [Pharmacy Med Name: KOMBIGLYZE XR 2.5-1000MG TABLETS] 180 tablet 1    Sig: TAKE 1 TABLET BY MOUTH TWICE DAILY     Endocrinology:  Diabetes - Biguanide + DPP-4 Inhibitor Combos Failed - 11/06/2018  2:23 PM      Failed - HBA1C is between 0 and 7.9 and within 180 days    Hemoglobin A1C  Date Value Ref Range Status  08/20/2016 7.4  Final   HB A1C (BAYER DCA - WAIVED)  Date Value Ref Range Status  11/22/2017 6.0 <7.0 % Final    Comment:                                          Diabetic Adult            <7.0                                       Healthy Adult        4.3 - 5.7                                                           (DCCT/NGSP) American Diabetes Association's Summary of Glycemic Recommendations for Adults with Diabetes: Hemoglobin A1c <7.0%. More stringent glycemic goals (A1c <6.0%) may further reduce complications at the cost of increased risk of hypoglycemia.          Failed - Valid encounter within last 6 months    Recent Outpatient Visits          11 months ago Routine general medical examination at a health care facility   [facility], [place] P, DO   1 year ago Controlled type 2 diabetes mellitus with microalbuminuria, without long-term current use of insulin ([place])   [place], [place] P, DO   1 year ago Hyperlipidemia, unspecified hyperlipidemia type   [facility], [place] P, DO   1 year ago Uncontrolled type 2 diabetes mellitus with microalbuminuria, without long-term current use of insulin ([place])   [place], [place], DO   2 years ago Routine  general medical examination at a health care facility   [facility], [place] P, DO             Passed - Cr in normal range and within 360 days    Creatinine, Ser  Date Value Ref Range Status  11/22/2017 0.59 0.57 - 1.00 mg/dL Final         Passed - eGFR in normal range and within 360 days    GFR calc Af Amer  Date Value Ref Range Status  11/22/2017 123 >59 mL/min/1.73 Final   GFR calc non Af Amer  Date Value Ref Range Status  11/22/2017 106 >59 mL/min/1.73  Final

## 2018-11-07 NOTE — Telephone Encounter (Signed)
Has not been seen in a year. Needs a follow up appointment.

## 2018-11-07 NOTE — Telephone Encounter (Signed)
Called pt to schedule appt for refill, no answer, left voicemail to call back.

## 2021-02-01 ENCOUNTER — Other Ambulatory Visit: Payer: Self-pay | Admitting: Internal Medicine

## 2021-02-01 DIAGNOSIS — M25562 Pain in left knee: Secondary | ICD-10-CM

## 2021-02-01 DIAGNOSIS — M25561 Pain in right knee: Secondary | ICD-10-CM

## 2021-02-24 ENCOUNTER — Ambulatory Visit
Admission: RE | Admit: 2021-02-24 | Discharge: 2021-02-24 | Disposition: A | Discharge status: Home / Self Care | Payer: Disability Insurance | Attending: Internal Medicine | Admitting: Internal Medicine

## 2021-02-24 ENCOUNTER — Ambulatory Visit
Admission: RE | Admit: 2021-02-24 | Discharge: 2021-02-24 | Disposition: A | Discharge status: Home / Self Care | Payer: Disability Insurance | Source: Ambulatory Visit | Attending: Internal Medicine | Admitting: Internal Medicine

## 2021-02-24 DIAGNOSIS — M170 Bilateral primary osteoarthritis of knee: Secondary | ICD-10-CM | POA: Diagnosis not present

## 2021-02-24 DIAGNOSIS — M25561 Pain in right knee: Secondary | ICD-10-CM

## 2021-02-24 DIAGNOSIS — M25562 Pain in left knee: Secondary | ICD-10-CM | POA: Diagnosis not present

## 2021-10-07 IMAGING — CR DG KNEE COMPLETE 4+V*L*
1 series · 4 of 4 positions shown · non-contrast
Comparison: None.

CLINICAL DATA: Knee pain

EXAM:
LEFT KNEE - COMPLETE 4+ VIEW

[Series 1: dg knee complete 4 views left · 0.14mm/px · 4 of 4 slices shown]
[im 1/4]
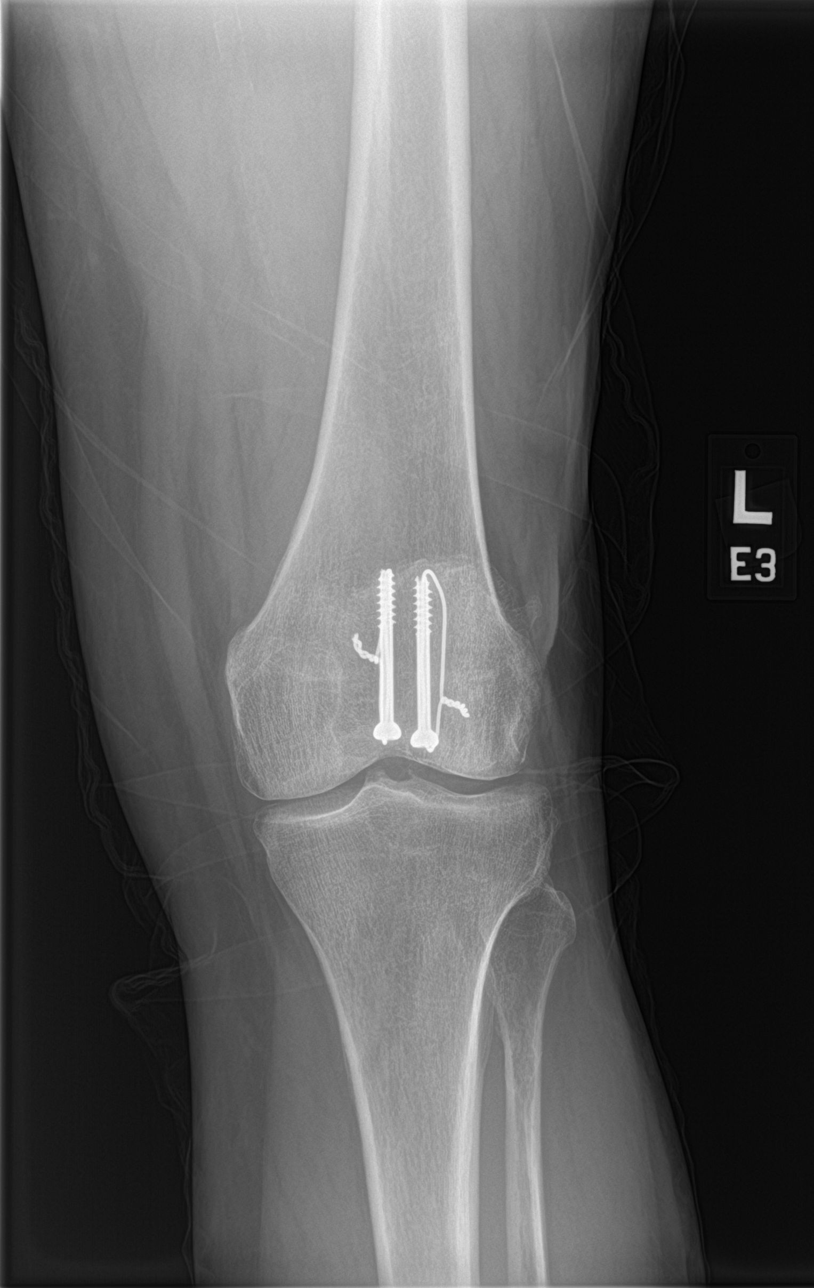
[im 2/4]
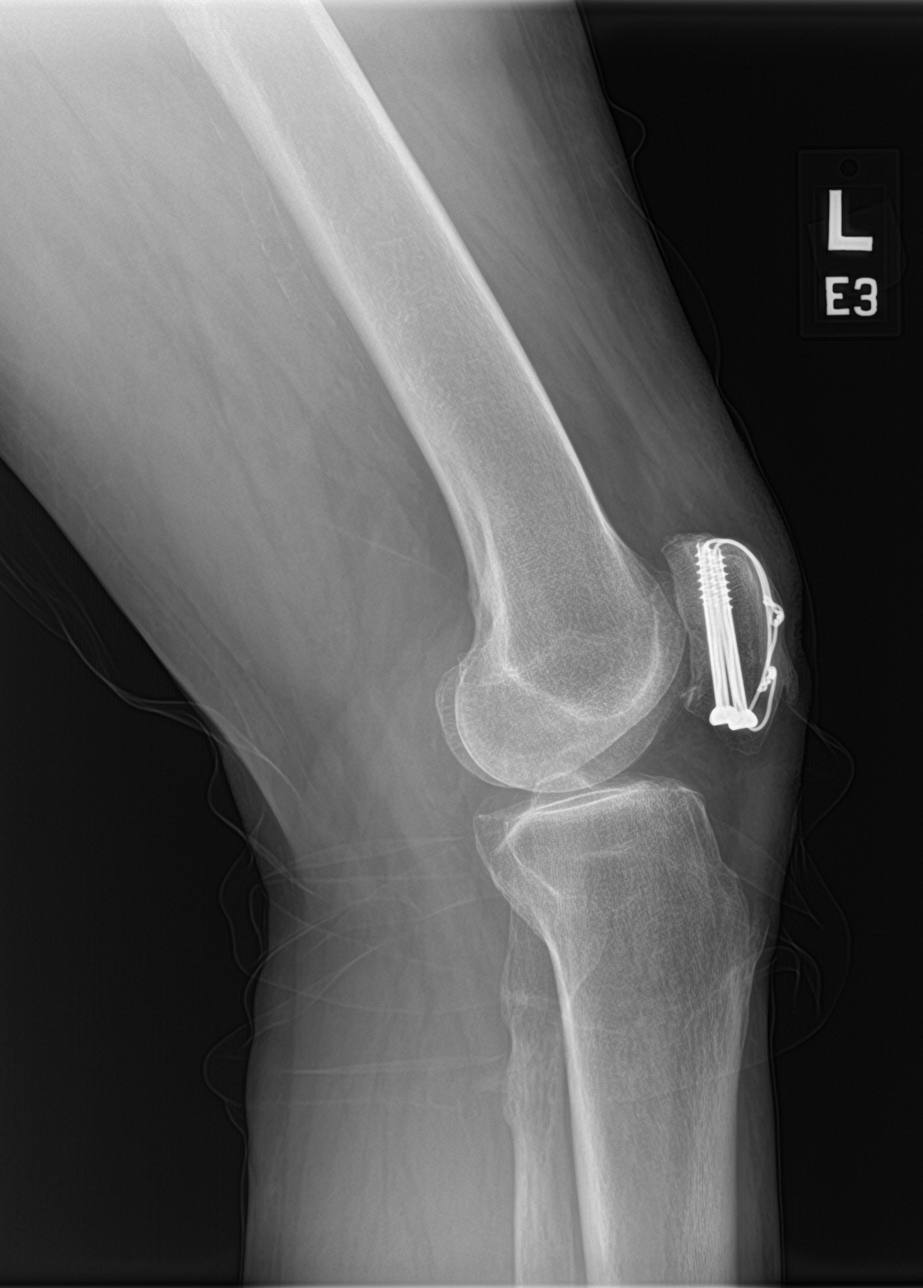
[im 3/4]
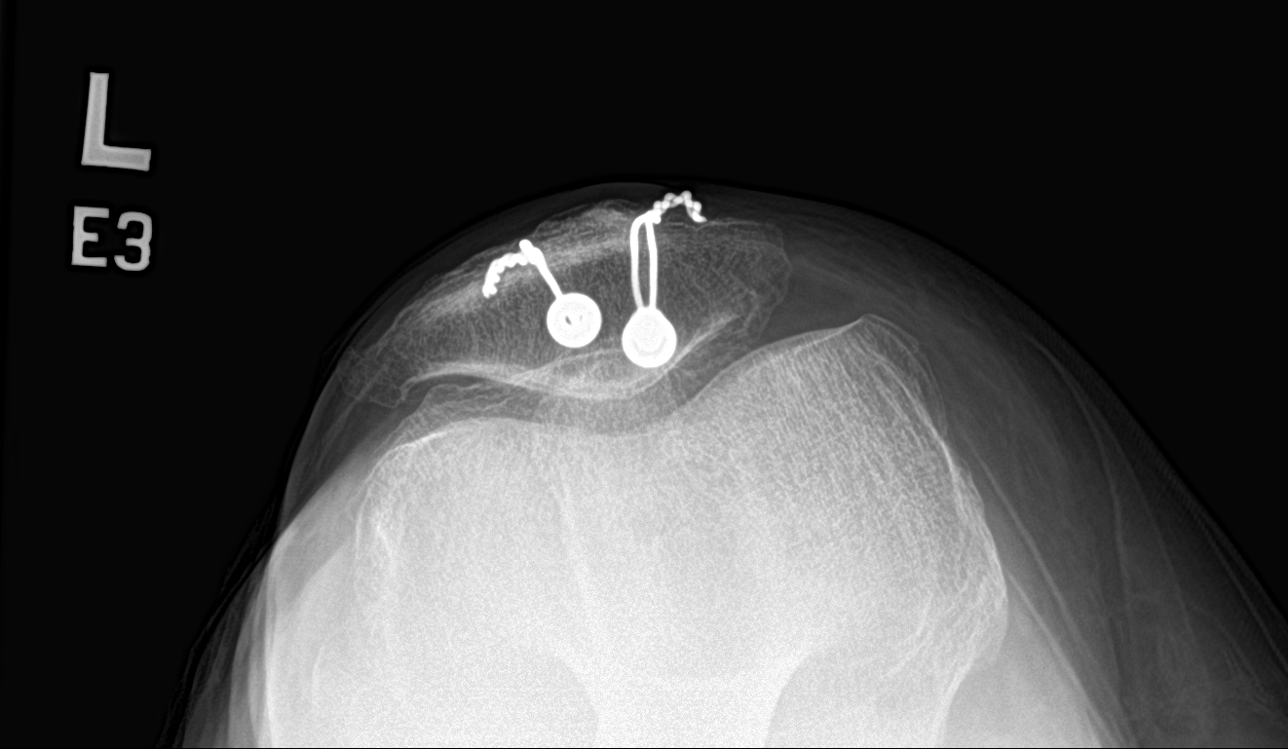
[im 4/4]
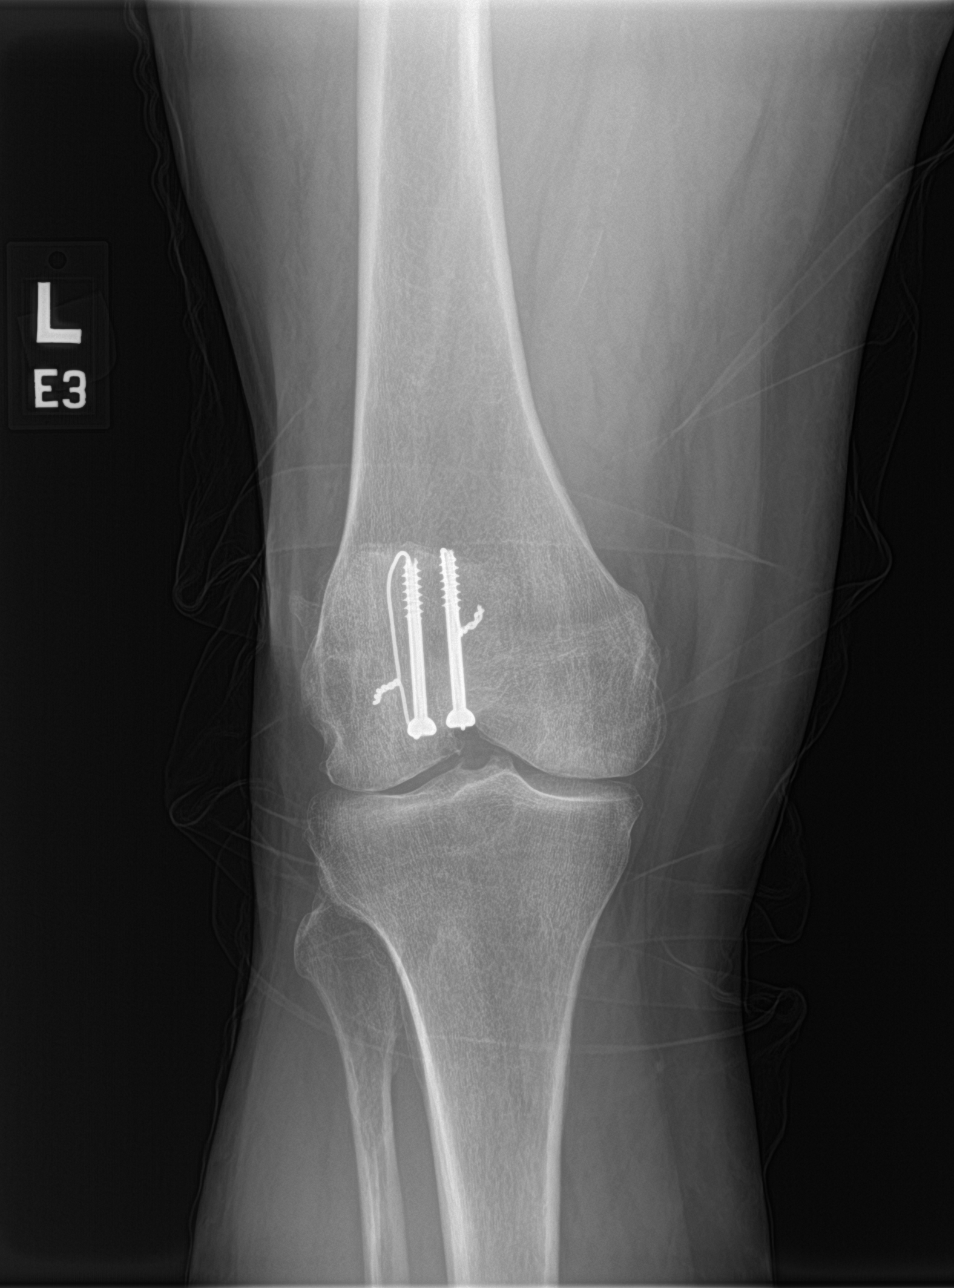

[4 of 4 positions shown; findings below may reference images not displayed]

FINDINGS: Previous patellar fixation with intact appearing hardware. No
fracture or malalignment. Mild lateral and patellofemoral
degenerative change. No sizable knee effusion.
IMPRESSION: Postsurgical changes of the patella. There are mild degenerative
changes of the knee

## 2022-07-17 ENCOUNTER — Other Ambulatory Visit: Payer: Self-pay

## 2022-07-17 MED ORDER — ESOMEPRAZOLE MAGNESIUM 40 MG PO CPDR: 40.0000 mg | DELAYED_RELEASE_CAPSULE | Freq: Every day | ORAL | 3 refills | Status: AC

## 2022-09-06 ENCOUNTER — Telehealth: Payer: Self-pay

## 2022-09-06 NOTE — Telephone Encounter (Signed)
Pt's pharmacy faxed regarding rx novolog 70/30 flexpen rx, said brand is on back order. Do you want to send alternate rx or do you want me to do PA? Please advise

## 2022-09-07 ENCOUNTER — Other Ambulatory Visit: Payer: Self-pay

## 2022-09-07 NOTE — Addendum Note (Signed)
Addended by: Grayling Congress on: 09/07/2022 05:24 PM   Modules accepted: Orders

## 2022-11-17 ENCOUNTER — Other Ambulatory Visit: Payer: Self-pay | Admitting: Family
